# Patient Record
Sex: Female | Born: 1960 | Race: White | Hispanic: No | Marital: Married | Smoking: Former smoker
Health system: Southern US, Community
[De-identification: ages and names within clinical notes are randomized; demographics above are authoritative.]

## PROBLEM LIST (undated history)

## (undated) DIAGNOSIS — K5909 Other constipation: Secondary | ICD-10-CM

## (undated) DIAGNOSIS — F419 Anxiety disorder, unspecified: Secondary | ICD-10-CM

## (undated) DIAGNOSIS — F329 Major depressive disorder, single episode, unspecified: Secondary | ICD-10-CM

## (undated) DIAGNOSIS — M419 Scoliosis, unspecified: Secondary | ICD-10-CM

## (undated) DIAGNOSIS — K219 Gastro-esophageal reflux disease without esophagitis: Secondary | ICD-10-CM

## (undated) DIAGNOSIS — F909 Attention-deficit hyperactivity disorder, unspecified type: Secondary | ICD-10-CM

## (undated) DIAGNOSIS — Z973 Presence of spectacles and contact lenses: Secondary | ICD-10-CM

## (undated) DIAGNOSIS — M26609 Unspecified temporomandibular joint disorder, unspecified side: Secondary | ICD-10-CM

## (undated) DIAGNOSIS — F32A Depression, unspecified: Secondary | ICD-10-CM

## (undated) DIAGNOSIS — N201 Calculus of ureter: Secondary | ICD-10-CM

## (undated) DIAGNOSIS — E785 Hyperlipidemia, unspecified: Secondary | ICD-10-CM

## (undated) HISTORY — PX: BREAST BIOPSY: SHX20

## (undated) HISTORY — PX: TONSILLECTOMY: SUR1361

---
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
## 1998-12-17 ENCOUNTER — Encounter: Payer: Self-pay | Admitting: Obstetrics and Gynecology

## 1998-12-17 ENCOUNTER — Ambulatory Visit (HOSPITAL_COMMUNITY): Admission: RE | Admit: 1998-12-17 | Discharge: 1998-12-17 | Payer: Self-pay | Admitting: Obstetrics and Gynecology

## 1999-01-03 ENCOUNTER — Encounter: Payer: Self-pay | Admitting: Obstetrics and Gynecology

## 1999-01-03 ENCOUNTER — Ambulatory Visit (HOSPITAL_COMMUNITY): Admission: RE | Admit: 1999-01-03 | Discharge: 1999-01-03 | Payer: Self-pay | Admitting: Obstetrics and Gynecology

## 1999-01-18 ENCOUNTER — Ambulatory Visit (HOSPITAL_COMMUNITY): Admission: RE | Admit: 1999-01-18 | Discharge: 1999-01-18 | Payer: Self-pay | Admitting: Obstetrics and Gynecology

## 1999-01-18 ENCOUNTER — Encounter: Payer: Self-pay | Admitting: Obstetrics and Gynecology

## 1999-01-18 ENCOUNTER — Encounter (INDEPENDENT_AMBULATORY_CARE_PROVIDER_SITE_OTHER): Payer: Self-pay | Admitting: *Deleted

## 1999-03-05 ENCOUNTER — Other Ambulatory Visit: Admission: RE | Admit: 1999-03-05 | Discharge: 1999-03-05 | Payer: Self-pay | Admitting: Obstetrics and Gynecology

## 1999-06-27 ENCOUNTER — Emergency Department (HOSPITAL_COMMUNITY): Admission: EM | Admit: 1999-06-27 | Discharge: 1999-06-27 | Payer: Self-pay | Admitting: Emergency Medicine

## 1999-06-27 ENCOUNTER — Encounter: Payer: Self-pay | Admitting: Emergency Medicine

## 2000-04-30 ENCOUNTER — Encounter (INDEPENDENT_AMBULATORY_CARE_PROVIDER_SITE_OTHER): Payer: Self-pay | Admitting: Specialist

## 2000-04-30 ENCOUNTER — Ambulatory Visit (HOSPITAL_COMMUNITY): Admission: AD | Admit: 2000-04-30 | Discharge: 2000-04-30 | Payer: Self-pay | Admitting: Obstetrics and Gynecology

## 2000-04-30 HISTORY — PX: DILATATION & CURRETTAGE/HYSTEROSCOPY WITH RESECTOCOPE: SHX5572

## 2000-05-05 ENCOUNTER — Encounter: Payer: Self-pay | Admitting: Obstetrics and Gynecology

## 2000-05-05 ENCOUNTER — Ambulatory Visit (HOSPITAL_COMMUNITY): Admission: RE | Admit: 2000-05-05 | Discharge: 2000-05-05 | Payer: Self-pay | Admitting: Obstetrics and Gynecology

## 2000-05-07 ENCOUNTER — Other Ambulatory Visit: Admission: RE | Admit: 2000-05-07 | Discharge: 2000-05-07 | Payer: Self-pay | Admitting: Obstetrics and Gynecology

## 2000-05-12 ENCOUNTER — Inpatient Hospital Stay (HOSPITAL_COMMUNITY): Admission: RE | Admit: 2000-05-12 | Discharge: 2000-05-14 | Payer: Self-pay | Admitting: Obstetrics and Gynecology

## 2000-05-12 ENCOUNTER — Encounter (INDEPENDENT_AMBULATORY_CARE_PROVIDER_SITE_OTHER): Payer: Self-pay

## 2000-05-12 HISTORY — PX: VAGINAL HYSTERECTOMY: SUR661

## 2001-05-18 ENCOUNTER — Other Ambulatory Visit: Admission: RE | Admit: 2001-05-18 | Discharge: 2001-05-18 | Payer: Self-pay | Admitting: Obstetrics and Gynecology

## 2001-05-18 ENCOUNTER — Ambulatory Visit (HOSPITAL_COMMUNITY): Admission: RE | Admit: 2001-05-18 | Discharge: 2001-05-18 | Payer: Self-pay | Admitting: Obstetrics and Gynecology

## 2001-05-18 ENCOUNTER — Encounter: Payer: Self-pay | Admitting: Obstetrics and Gynecology

## 2002-05-20 ENCOUNTER — Encounter: Payer: Self-pay | Admitting: Obstetrics and Gynecology

## 2002-05-20 ENCOUNTER — Ambulatory Visit (HOSPITAL_COMMUNITY): Admission: RE | Admit: 2002-05-20 | Discharge: 2002-05-20 | Payer: Self-pay | Admitting: Obstetrics and Gynecology

## 2002-05-20 ENCOUNTER — Other Ambulatory Visit: Admission: RE | Admit: 2002-05-20 | Discharge: 2002-05-20 | Payer: Self-pay | Admitting: Obstetrics and Gynecology

## 2003-05-23 ENCOUNTER — Ambulatory Visit (HOSPITAL_COMMUNITY): Admission: RE | Admit: 2003-05-23 | Discharge: 2003-05-23 | Payer: Self-pay | Admitting: Obstetrics and Gynecology

## 2004-06-05 ENCOUNTER — Other Ambulatory Visit: Admission: RE | Admit: 2004-06-05 | Discharge: 2004-06-05 | Payer: Self-pay | Admitting: Obstetrics and Gynecology

## 2004-06-05 ENCOUNTER — Ambulatory Visit (HOSPITAL_COMMUNITY): Admission: RE | Admit: 2004-06-05 | Discharge: 2004-06-05 | Payer: Self-pay | Admitting: Obstetrics and Gynecology

## 2005-08-07 ENCOUNTER — Ambulatory Visit (HOSPITAL_COMMUNITY): Admission: RE | Admit: 2005-08-07 | Discharge: 2005-08-07 | Payer: Self-pay | Admitting: Obstetrics and Gynecology

## 2006-08-20 ENCOUNTER — Ambulatory Visit (HOSPITAL_COMMUNITY): Admission: RE | Admit: 2006-08-20 | Discharge: 2006-08-20 | Payer: Self-pay | Admitting: Obstetrics and Gynecology

## 2007-10-20 ENCOUNTER — Ambulatory Visit (HOSPITAL_COMMUNITY): Admission: RE | Admit: 2007-10-20 | Discharge: 2007-10-20 | Payer: Self-pay | Admitting: Obstetrics and Gynecology

## 2008-12-07 ENCOUNTER — Ambulatory Visit (HOSPITAL_COMMUNITY): Admission: RE | Admit: 2008-12-07 | Discharge: 2008-12-07 | Payer: Self-pay | Admitting: Obstetrics and Gynecology

## 2009-06-13 DIAGNOSIS — M4120 Other idiopathic scoliosis, site unspecified: Secondary | ICD-10-CM | POA: Insufficient documentation

## 2009-06-13 DIAGNOSIS — G43009 Migraine without aura, not intractable, without status migrainosus: Secondary | ICD-10-CM | POA: Insufficient documentation

## 2009-07-12 DIAGNOSIS — K5730 Diverticulosis of large intestine without perforation or abscess without bleeding: Secondary | ICD-10-CM | POA: Insufficient documentation

## 2009-07-16 ENCOUNTER — Encounter: Admission: RE | Admit: 2009-07-16 | Discharge: 2009-07-16 | Payer: Self-pay | Admitting: Family Medicine

## 2009-09-28 ENCOUNTER — Encounter: Admission: RE | Admit: 2009-09-28 | Discharge: 2009-09-28 | Payer: Self-pay | Admitting: Family Medicine

## 2009-12-10 ENCOUNTER — Ambulatory Visit (HOSPITAL_COMMUNITY): Admission: RE | Admit: 2009-12-10 | Discharge: 2009-12-10 | Payer: Self-pay | Admitting: Family Medicine

## 2010-06-26 ENCOUNTER — Encounter
Admission: RE | Admit: 2010-06-26 | Discharge: 2010-06-26 | Payer: Self-pay | Source: Home / Self Care | Admitting: Specialist

## 2010-07-01 HISTORY — PX: LAPAROSCOPIC CHOLECYSTECTOMY: SUR755

## 2010-07-10 ENCOUNTER — Ambulatory Visit (HOSPITAL_COMMUNITY)
Admission: RE | Admit: 2010-07-10 | Discharge: 2010-07-11 | Payer: Self-pay | Source: Home / Self Care | Attending: General Surgery | Admitting: General Surgery

## 2010-07-20 ENCOUNTER — Encounter
Admission: RE | Admit: 2010-07-20 | Discharge: 2010-07-20 | Payer: Self-pay | Source: Home / Self Care | Attending: Specialist | Admitting: Specialist

## 2010-09-30 LAB — COMPREHENSIVE METABOLIC PANEL
ALT: 31 U/L (ref 0–35)
AST: 30 U/L (ref 0–37)
Albumin: 3.4 g/dL — ABNORMAL LOW (ref 3.5–5.2)
Alkaline Phosphatase: 72 U/L (ref 39–117)
BUN: 10 mg/dL (ref 6–23)
CO2: 32 mEq/L (ref 19–32)
Calcium: 9 mg/dL (ref 8.4–10.5)
Chloride: 100 mEq/L (ref 96–112)
Creatinine, Ser: 0.81 mg/dL (ref 0.4–1.2)
GFR calc Af Amer: >60 mL/min (ref >60)
GFR calc non Af Amer: >60 mL/min (ref >60)
Glucose, Bld: 95 mg/dL (ref 70–99)
Potassium: 4.4 mEq/L (ref 3.5–5.1)
Sodium: 139 mEq/L (ref 135–145)
Total Bilirubin: 0.3 mg/dL (ref 0.3–1.2)
Total Protein: 6.6 g/dL (ref 6.0–8.3)

## 2010-09-30 LAB — DIFFERENTIAL
Basophils Absolute: 0.1 10*3/uL (ref 0.0–0.1)
Basophils Relative: 1 % (ref 0–1)
Eosinophils Absolute: 0.2 10*3/uL (ref 0.0–0.7)
Eosinophils Relative: 3 % (ref 0–5)
Lymphocytes Relative: 46 % (ref 12–46)
Lymphs Abs: 3.2 10*3/uL (ref 0.7–4.0)
Monocytes Absolute: 0.4 10*3/uL (ref 0.1–1.0)
Monocytes Relative: 6 % (ref 3–12)
Neutro Abs: 3.1 10*3/uL (ref 1.7–7.7)
Neutrophils Relative %: 44 % (ref 43–77)

## 2010-09-30 LAB — SURGICAL PCR SCREEN
MRSA, PCR: NEGATIVE
Staphylococcus aureus: NEGATIVE

## 2010-09-30 LAB — CBC
HCT: 40.4 % (ref 36.0–46.0)
Hemoglobin: 13.6 g/dL (ref 12.0–15.0)
MCH: 32.9 pg (ref 26.0–34.0)
MCHC: 33.7 g/dL (ref 30.0–36.0)
MCV: 97.6 fL (ref 78.0–100.0)
Platelets: 308 10*3/uL (ref 150–400)
RBC: 4.14 MIL/uL (ref 3.87–5.11)
RDW: 12.8 % (ref 11.5–15.5)
WBC: 7.1 10*3/uL (ref 4.0–10.5)

## 2010-09-30 LAB — APTT: aPTT: 28 seconds (ref 24–37)

## 2010-09-30 LAB — PROTIME-INR
INR: 0.97 (ref 0.00–1.49)
Prothrombin Time: 13.1 seconds (ref 11.6–15.2)

## 2010-10-03 ENCOUNTER — Other Ambulatory Visit: Payer: Self-pay | Admitting: Family Medicine

## 2010-10-03 DIAGNOSIS — Z1231 Encounter for screening mammogram for malignant neoplasm of breast: Secondary | ICD-10-CM

## 2010-12-06 NOTE — Discharge Summary (Signed)
[facility]  Patient:    Marissa Maxwell, Marissa Maxwell                       MRN: [id number] Adm. Date:  [id number] Disc. Date: 05/14/00 Attending:  Malon Kindle                           Discharge Summary  HOSPITAL COURSE: A 50 year old with severe menorrhagia, anemia, and submucous fibroid admitted for vaginal hysterectomy.  The patient underwent a vaginal hysterectomy by Dr. Ambrose Mantle with Dr. Jackelyn Knife assisting under general anesthesia on May 12, 2000.  Blood loss about 200 cc.  Postoperatively, the patient did well, although she does state she awakened in the recovery room with very severe low back pain.  She has had no evidence of muscle weakness or nerve injury.  The pain is over the sacral area and is thought to be related to either the position or the hysterectomy itself.  The patient has voided well since her catheter has been removed.  She is passing flatus, tolerating a regular diet, and is ready for discharge.  LABORATORY DATA:  The pathology report showed the uterus with no pathologic abnormalities of the cervix, benign proliferative endometrium, adenomyosis, benign submucosal leiomyoma.  Hemoglobin on admission was 10.8, hematocrit 31.2, white count 8100.  Normal indices.  Platelet count 317,000, 61 neutrophils, 30 lymphs, 3 monos, 2 eosinophils, and 1 basophil.  Comprehensive metabolic profile was completely normal as was the urinalysis.  Blood group and type was A positive with a negative antibody.  FINAL DIAGNOSES: 1. Severe menorrhagia. 2. Anemia. 3. Submucous fibroid. 4. Adenomyosis.  OPERATION:  Vaginal hysterectomy.  Operator: Malachi Pro. Ambrose Mantle, M.D.  FINAL CONDITION:  Improved.  DISCHARGE INSTRUCTIONS:  Regular discharge instructions.  No vaginal entrance, no heavy lifting or strenuous activity.  Call with temperature elevation greater than 100.4 degrees.  Call with any heavy vaginal bleeding.  Call with any unusual  problems.  Mepergan Fortis 20 tablets 1 every 4 to 6 hours is given at discharge.  The patient is advised to use heat on her lower back and return to the office in two weeks for followup examination. DD:  05/14/00 TD:  05/14/00 Job: [id number]

## 2010-12-06 NOTE — Op Note (Signed)
[place]  Patient:    Marissa Maxwell, Marissa Maxwell                         MRN: [id number] Proc. Date: 04/30/00 Attending:  Malachi Pro. Ambrose Mantle, M.D.                           Operative Report  PREOPERATIVE DIAGNOSIS:  Menorrhagia.  POSTOPERATIVE DIAGNOSIS:  Menorrhagia.  Submucous fibroid.  OPERATION PERFORMED:  Dilatation and curettage, hysteroscopy.  Partial resection of submucous fibroid.  SURGEON:  Malachi Pro. Ambrose Mantle, M.D.  ANESTHESIA:  General.  DESCRIPTION OF PROCEDURE:  The patient was brought to the operating room and placed under satisfactory general endotracheal and placed in lithotomy.  She had begun her period on April 20, 2000.  It lasted for five or six days and then almost quit and then beginning on April 25, 2000, she began bleeding extremely heavily, passing large clots and today she became lightheaded.  She came to the office for examination.  She was noted to 30 cc of blood clot in her vagina.  After it was evacuated, she was examined again 5 or 10 minutes later and another 30 cc clot was present.  There were no lacerations in the vagina.  The bleeding was coming from the uterus.  The patient was scheduled for D&C after being off her birth control pills for just observation.  After she was seen, she said the bleeding stopped for a couple of hours but then started heavy again.  In lithotomy position, the exam revealed the uterus to be anterior upper limits normal size, the adnexa were free of masses.  The vulva, vagina and perineum and urethra were prepped with Betadine solution, the bladder was emptied with [place] catheter although there was no urine present.  The cervix was drawn into the operative field and dilated to a 35 Pratt dilator.  Because the patient was bleeding fairly heavily, I elected to do a fractional D&C to start with, curetting the endocervix, preserving that tissue, then curetting the endometrial cavity and preserving  that tissue. There felt to be an irregularity on the posterior surface of the uterus.  I inserted the hysteroscope after preserving the endometrial tissue and went all the way to the fundus and really could see no abnormalities but as I retracted the fundus down close to the lower uterine segment, there was an obvious submucous tumor that was either a polyp or a fibroid.  I tried to biopsy it with the small biopsy forceps and got no tissue.  I then changed the resectoscope and had had persistent difficulty difficult visualizing the area above the fibroid since it was so large and basically filled the cavity.  I would place the loop above the fibroid and I did make a couple of resections, but I never was able to feel comfortable about the upper extent of the loop. I could not see around it, so I tried to use polyp forceps and uterine dressing forceps to grasp the fibroid which I did and removed several pieces of the fibroid but was never able to remove the great majority of the fibroid but because of the difficulty as stated earlier, I did not think it was wise to persist in trying to use electrical current and I terminated the procedure. Blood loss was probably no more than 25 cc.  The patient was returned to recovery.  She will  either require Lupron to possibly shrink the fibroid and then have endometrial resection by hysteroscopy or a hysterectomy. DD:  05/01/99 TD:  05/02/00 Job: [id number]

## 2010-12-06 NOTE — H&P (Signed)
[facility]  Patient:    Marissa Maxwell, Marissa Maxwell                       MRN: [id number] Adm. Date:  [id number] Disc. Date: [id number] Attending:  Malon Kindle                         History and Physical  HISTORY OF PRESENT ILLNESS:  The patient is a 50 year old white married female, para 3-0-1-3 who is admitted to the hospital for hysterectomy because of submucous myoma and history of extremely severe menorrhagia.  Her last menstrual period was on April 20, 2000.  It persisted for three weeks.  She was seen in the office on April 30, 2000, and she was bleeding extremely heavily.  She was scheduled for D&C to stop the bleeding and, at hysteroscopy, was noted to have a large submucous myoma coming from the posterior aspect of the right side of the uterus.  It was difficult to remove this because the loop electrode would get lost at it passed over the large submucous myoma.  I therefore advised her after the D&C to consider her options of Lupron followed by repeat hysteroscopy and attempted removal of the submucous myoma or hysterectomy.  She chose to proceed with hysterectomy.  ALLERGIES:  PENICILLIN, which causes a RASH.  PAST SURGICAL HISTORY:  Cesarean section.  T&A.  Scoliosis surgery with insertion of Harrington rods.  D&C in 2001.  PAST MEDICAL HISTORY:  Usual childhood diseases.  FAMILY HISTORY:  Her mother is 20, living and well.  Her father is 70 and had myocardial infarction at the age of 71.  One brother and one sister living and well.  ALCOHOL AND TOBACCO:  None.  MEDICATIONS:  Vitamins.  REVIEW OF SYSTEMS:  Frequent headaches.  PHYSICAL EXAMINATION:  GENERAL:  Well-developed, well-nourished white female.  VITAL SIGNS:  Blood pressure 100/60, pulse 80.  HEENT:  No cranial abnormalities.  Extraocular movements intact.  Nose and pharynx clear.  NECK:  Supple without thyromegaly.  HEART:  Normal size and sounds.  No  murmurs.  LUNGS:  Clear to P&A.  BREASTS:  Soft without masses on April 30, 2000.  ABDOMEN:  Soft, flat and nontender.  PELVIC:  Dark blood in the vagina.  The cervix is clean.  The uterus is anterior, upper limit of normal size.  Adnexa are free of masses.  ADMITTING IMPRESSION:  Submucous myoma with a history of extremely severe menorrhagia.  PLAN:  The patient is admitted for vaginal hysterectomy followed by abdominal hysterectomy if the vaginal hysterectomy is unsuccessful.  She has been informed of the potential risks of surgery including but not limited to pulmonary embolus, wound disruption, hemorrhage with need for reoperation and/or transfusion, fistula formation, nerve injury, and intestinal obstruction.  She understands and agrees to proceed.  Please note that the biopsies on the D&C and hysteroscopy were smooth muscle consistent with leiomyoma and benign endometrium and endocervical curettings.  The patient also has a history of cervical dysplasia treated with cryotherapy. DD:  05/12/00 TD:  05/12/00 Job: [id number]

## 2010-12-06 NOTE — Op Note (Signed)
[facility]  Patient:    Marissa Maxwell, Marissa Maxwell                       MRN: [id number] Proc. Date: 05/12/00 Adm. Date:  [id number] Disc. Date: [id number] Attending:  Malon Kindle                           Operative Report  PREOPERATIVE DIAGNOSES: 1. Submucous leiomyoma. 2. Menorrhagia. 3. Anemia.  POSTOPERATIVE DIAGNOSES: 1. Submucous leiomyoma. 2. Menorrhagia. 3. Anemia.  PROCEDURE:  Vaginal hysterectomy.  SURGEON:  Malachi Pro. Ambrose Mantle, M.D.  ASSISTANT:  Zenaida Niece, M.D.  ANESTHESIA:  General anesthesia.  DESCRIPTION OF PROCEDURE:  The patient was brought to the operating room and placed under satisfactory general anesthesia and placed in lithotomy position. Exam revealed the uterus to be anterior, upper limit of normal size, the adnexa free of masses.  The vulva, vagina, perineum, and urethra were prepped with Betadine solution.  A Foley catheter was inserted to straight drain.  The area was draped as a sterile field.  The cervix was drawn into the operative field, and the cervicovaginal junction was injected with a dilute solution of Neo-Synephrine.  A circumferential incision was made around the cervix at the cervicovaginal junction.  The vaginal mucosa was pushed anteriorly and posteriorly.  The posterior cul-de-sac was identified and entered by sharp dissection.  Both uterosacral ligaments were clamped, cut, and suture ligated and held.  The cardinal ligaments were clamped, cut, and suture ligated, and additional bites above the cardinal ligaments were taken and suture ligated. The anterior peritoneum was identified and entered.  The bladder was retracted away.  The additional bites were taken above the uterine vessels.  The uterus was then inverted through the incision in the cul-de-sac, and the upper pedicles were clamped across and doubly suture ligated.  Hemostasis was evaluated.  The posterior cuff was sutured to ensure  hemostasis.  A pursestring suture of #1 Vicryl was placed around the peritoneum, starting anteriorly with the peritoneum, incorporating the upper and lower pedicles, and posterior cul-de-sac on the left, along with the right uterosacral, right upper pedicle, and anterior peritoneum.  Before tying this down, I reinspected to try to be sure that hemostasis was completely adequate.  I secured complete hemostasis, tied the peritoneal pursestring suture down.  I did not actually feel either ovary or see either ovary.  I then closed the vaginal mucosa with interrupted figure-of-eight sutures of 0 Vicryl, placed a two-inch iodoform pack in the vagina, and terminated the procedure.  Blood loss was thought to be about 200 cc.  Sponge and needle counts were correct, and the patient was returned to recovery in satisfactory condition.  The uterus was cut open at the operating table, and the submucous fibroid on the right posterior wall of the uterus was confirmed.  The fibroid was probably about 2.5-3 cm in diameter. DD:  05/12/00 TD:  05/13/00 Job: [id number]

## 2010-12-12 ENCOUNTER — Ambulatory Visit: Payer: Self-pay

## 2011-02-18 ENCOUNTER — Other Ambulatory Visit (HOSPITAL_COMMUNITY): Payer: Self-pay | Admitting: Family Medicine

## 2011-02-18 DIAGNOSIS — Z1231 Encounter for screening mammogram for malignant neoplasm of breast: Secondary | ICD-10-CM

## 2011-02-26 ENCOUNTER — Ambulatory Visit (HOSPITAL_COMMUNITY)
Admission: RE | Admit: 2011-02-26 | Discharge: 2011-02-26 | Disposition: A | Discharge status: Home / Self Care | Payer: 59 | Source: Ambulatory Visit | Attending: Family Medicine | Admitting: Family Medicine

## 2011-02-26 DIAGNOSIS — Z1231 Encounter for screening mammogram for malignant neoplasm of breast: Secondary | ICD-10-CM | POA: Insufficient documentation

## 2011-06-26 ENCOUNTER — Other Ambulatory Visit: Payer: Self-pay | Admitting: Orthopedic Surgery

## 2011-07-01 ENCOUNTER — Encounter (HOSPITAL_BASED_OUTPATIENT_CLINIC_OR_DEPARTMENT_OTHER): Payer: Self-pay

## 2011-07-01 NOTE — Progress Notes (Signed)
Patient instructed to bring all meds.

## 2011-07-03 ENCOUNTER — Encounter (HOSPITAL_BASED_OUTPATIENT_CLINIC_OR_DEPARTMENT_OTHER): Payer: Self-pay | Admitting: Orthopedic Surgery

## 2011-07-03 ENCOUNTER — Encounter (HOSPITAL_BASED_OUTPATIENT_CLINIC_OR_DEPARTMENT_OTHER)
Admission: RE | Disposition: A | Discharge status: Home / Self Care | Payer: Self-pay | Source: Ambulatory Visit | Attending: Orthopedic Surgery

## 2011-07-03 ENCOUNTER — Encounter (HOSPITAL_BASED_OUTPATIENT_CLINIC_OR_DEPARTMENT_OTHER): Payer: Self-pay | Admitting: Certified Registered Nurse Anesthetist

## 2011-07-03 ENCOUNTER — Ambulatory Visit (HOSPITAL_BASED_OUTPATIENT_CLINIC_OR_DEPARTMENT_OTHER): Payer: 59 | Admitting: Certified Registered Nurse Anesthetist

## 2011-07-03 ENCOUNTER — Encounter (HOSPITAL_BASED_OUTPATIENT_CLINIC_OR_DEPARTMENT_OTHER): Payer: Self-pay | Admitting: *Deleted

## 2011-07-03 ENCOUNTER — Ambulatory Visit (HOSPITAL_BASED_OUTPATIENT_CLINIC_OR_DEPARTMENT_OTHER)
Admission: RE | Admit: 2011-07-03 | Discharge: 2011-07-03 | Disposition: A | Discharge status: Home / Self Care | Payer: 59 | Source: Ambulatory Visit | Attending: Orthopedic Surgery | Admitting: Orthopedic Surgery

## 2011-07-03 DIAGNOSIS — G5600 Carpal tunnel syndrome, unspecified upper limb: Secondary | ICD-10-CM | POA: Insufficient documentation

## 2011-07-03 DIAGNOSIS — K219 Gastro-esophageal reflux disease without esophagitis: Secondary | ICD-10-CM | POA: Insufficient documentation

## 2011-07-03 DIAGNOSIS — Z01812 Encounter for preprocedural laboratory examination: Secondary | ICD-10-CM | POA: Insufficient documentation

## 2011-07-03 HISTORY — DX: Depression, unspecified: F32.A

## 2011-07-03 HISTORY — DX: Major depressive disorder, single episode, unspecified: F32.9

## 2011-07-03 HISTORY — DX: Anxiety disorder, unspecified: F41.9

## 2011-07-03 HISTORY — PX: CARPAL TUNNEL RELEASE: SHX101

## 2011-07-03 HISTORY — DX: Gastro-esophageal reflux disease without esophagitis: K21.9

## 2011-07-03 SURGERY — CARPAL TUNNEL RELEASE
Anesthesia: General | Site: Hand | Laterality: Right | Wound class: Clean

## 2011-07-03 MED ORDER — FENTANYL CITRATE 0.05 MG/ML IJ SOLN
INTRAMUSCULAR | Status: DC | PRN
  Administered 2011-07-03: 50 ug via INTRAVENOUS

## 2011-07-03 MED ORDER — LACTATED RINGERS IV SOLN
INTRAVENOUS | Status: DC
Start: 2011-07-03 — End: 2011-07-03
  Administered 2011-07-03 (×2): via INTRAVENOUS

## 2011-07-03 MED ORDER — OXYCODONE-ACETAMINOPHEN 5-325 MG PO TABS: 1.0000 | ORAL_TABLET | ORAL | Status: AC | PRN

## 2011-07-03 MED ORDER — FENTANYL CITRATE 0.05 MG/ML IJ SOLN: 50.0000 ug | INTRAMUSCULAR | Status: DC | PRN

## 2011-07-03 MED ORDER — OXYCODONE-ACETAMINOPHEN 5-325 MG PO TABS
1.0000 | ORAL_TABLET | ORAL | Status: DC | PRN
  Administered 2011-07-03: 1 via ORAL

## 2011-07-03 MED ORDER — PROPOFOL 10 MG/ML IV EMUL
INTRAVENOUS | Status: DC | PRN
  Administered 2011-07-03: 200 mg via INTRAVENOUS

## 2011-07-03 MED ORDER — DEXAMETHASONE SODIUM PHOSPHATE 10 MG/ML IJ SOLN
INTRAMUSCULAR | Status: DC | PRN
  Administered 2011-07-03: 10 mg via INTRAVENOUS

## 2011-07-03 MED ORDER — MORPHINE SULFATE 2 MG/ML IJ SOLN: 0.0500 mg/kg | INTRAMUSCULAR | Status: DC | PRN

## 2011-07-03 MED ORDER — METOCLOPRAMIDE HCL 5 MG/ML IJ SOLN: 10.0000 mg | Freq: Once | INTRAMUSCULAR | Status: DC | PRN

## 2011-07-03 MED ORDER — CHLORHEXIDINE GLUCONATE 4 % EX LIQD: 60.0000 mL | Freq: Once | CUTANEOUS | Status: DC

## 2011-07-03 MED ORDER — MIDAZOLAM HCL 2 MG/2ML IJ SOLN: 0.5000 mg | INTRAMUSCULAR | Status: DC | PRN

## 2011-07-03 MED ORDER — CEFAZOLIN SODIUM 1-5 GM-% IV SOLN: 1.0000 g | Freq: Once | INTRAVENOUS | Status: DC

## 2011-07-03 MED ORDER — LIDOCAINE HCL (CARDIAC) 20 MG/ML IV SOLN
INTRAVENOUS | Status: DC | PRN
  Administered 2011-07-03: 40 mg via INTRAVENOUS

## 2011-07-03 MED ORDER — MIDAZOLAM HCL 5 MG/5ML IJ SOLN
INTRAMUSCULAR | Status: DC | PRN
  Administered 2011-07-03: 2 mg via INTRAVENOUS

## 2011-07-03 MED ORDER — LIDOCAINE HCL 2 % IJ SOLN
INTRAMUSCULAR | Status: DC | PRN
  Administered 2011-07-03: 4 mL

## 2011-07-03 MED ORDER — ONDANSETRON HCL 4 MG/2ML IJ SOLN
INTRAMUSCULAR | Status: DC | PRN
  Administered 2011-07-03: 4 mg via INTRAVENOUS

## 2011-07-03 MED ORDER — FENTANYL CITRATE 0.05 MG/ML IJ SOLN
25.0000 ug | INTRAMUSCULAR | Status: DC | PRN
  Administered 2011-07-03 (×2): 25 ug via INTRAVENOUS

## 2011-07-03 SURGICAL SUPPLY — 37 items
BANDAGE ADHESIVE 1X3 (GAUZE/BANDAGES/DRESSINGS) IMPLANT
BANDAGE ELASTIC 3 VELCRO ST LF (GAUZE/BANDAGES/DRESSINGS) ×2 IMPLANT
BLADE SURG 15 STRL LF DISP TIS (BLADE) ×1 IMPLANT
BLADE SURG 15 STRL SS (BLADE) ×1
BNDG ESMARK 4X9 LF (GAUZE/BANDAGES/DRESSINGS) ×2 IMPLANT
BRUSH SCRUB EZ PLAIN DRY (MISCELLANEOUS) ×2 IMPLANT
CLOTH BEACON ORANGE TIMEOUT ST (SAFETY) ×2 IMPLANT
CORDS BIPOLAR (ELECTRODE) ×2 IMPLANT
COVER MAYO STAND STRL (DRAPES) ×2 IMPLANT
COVER TABLE BACK 60X90 (DRAPES) ×2 IMPLANT
CUFF TOURNIQUET SINGLE 18IN (TOURNIQUET CUFF) ×2 IMPLANT
DECANTER SPIKE VIAL GLASS SM (MISCELLANEOUS) IMPLANT
DRAPE EXTREMITY T 121X128X90 (DRAPE) ×2 IMPLANT
DRAPE SURG 17X23 STRL (DRAPES) ×2 IMPLANT
GLOVE BIOGEL M 6.5 STRL (GLOVE) ×2 IMPLANT
GLOVE BIOGEL M STRL SZ7.5 (GLOVE) ×2 IMPLANT
GLOVE INDICATOR 7.0 STRL GRN (GLOVE) ×4 IMPLANT
GLOVE ORTHO TXT STRL SZ7.5 (GLOVE) ×2 IMPLANT
GOWN PREVENTION PLUS XLARGE (GOWN DISPOSABLE) ×2 IMPLANT
GOWN PREVENTION PLUS XXLARGE (GOWN DISPOSABLE) ×4 IMPLANT
NEEDLE 27GAX1X1/2 (NEEDLE) ×2 IMPLANT
PACK BASIN DAY SURGERY FS (CUSTOM PROCEDURE TRAY) ×2 IMPLANT
PAD CAST 3X4 CTTN HI CHSV (CAST SUPPLIES) ×1 IMPLANT
PADDING CAST ABS 4INX4YD NS (CAST SUPPLIES) ×1
PADDING CAST ABS COTTON 4X4 ST (CAST SUPPLIES) ×1 IMPLANT
PADDING CAST COTTON 3X4 STRL (CAST SUPPLIES) ×1
SPLINT PLASTER CAST XFAST 3X15 (CAST SUPPLIES) ×5 IMPLANT
SPLINT PLASTER XTRA FASTSET 3X (CAST SUPPLIES) ×5
SPONGE GAUZE 4X4 12PLY (GAUZE/BANDAGES/DRESSINGS) ×2 IMPLANT
STOCKINETTE 4X48 STRL (DRAPES) ×2 IMPLANT
STRIP CLOSURE SKIN 1/2X4 (GAUZE/BANDAGES/DRESSINGS) ×2 IMPLANT
SUT PROLENE 3 0 PS 2 (SUTURE) ×2 IMPLANT
SYR 3ML 23GX1 SAFETY (SYRINGE) IMPLANT
SYR CONTROL 10ML LL (SYRINGE) ×2 IMPLANT
TRAY DSU PREP LF (CUSTOM PROCEDURE TRAY) ×2 IMPLANT
UNDERPAD 30X30 INCONTINENT (UNDERPADS AND DIAPERS) ×2 IMPLANT
WATER STERILE IRR 1000ML POUR (IV SOLUTION) IMPLANT

## 2011-07-03 NOTE — H&P (Signed)
Marissa Maxwell is an 50 y.o. female.   Chief Complaint: Complaining of chronic and progressive right hand numbness and tingling. HPI: Marissa Maxwell is a 50 year old right-hand-dominant female who presented to our office at the beginning of December 2012 complaining of chronic and progressive right hand numbness and tingling. She had previous nerve conduction studies in 2011 which revealed right carpal tunnel syndrome. The patient is being awakened at night 5-6 times per week with persistent numbness and tingling. She wishes to proceed with surgical intervention.  Past Medical History  Diagnosis Date  . Scoliosis      two herrington rods  . GERD (gastroesophageal reflux disease)   . Anxiety   . Depression     Past Surgical History  Procedure Date  . Abdominal hysterectomy   . Lap choli   . Tonsillectomy   . Back surgery     History reviewed. No pertinent family history. Social History:  does not have a smoking history on file. She does not have any smokeless tobacco history on file. She reports that she does not drink alcohol or use illicit drugs.  Allergies:  Allergies  Allergen Reactions  . Penicillins Rash    Medications Prior to Admission  Medication Dose Route Frequency Provider Last Rate Last Dose  . ceFAZolin (ANCEF) IVPB 1 g/50 mL premix  1 g Intravenous Once       . chlorhexidine (HIBICLENS) 4 % liquid 4 application  60 mL Topical Once       . lactated ringers infusion   Intravenous Continuous Constance Goltz, MD       Medications Prior to Admission  Medication Sig Dispense Refill  . alum & mag hydroxide-simeth (MAALOX PLUS) 400-400-40 MG/5ML suspension Take by mouth every 6 (six) hours as needed.        Marland Kitchen amitriptyline (ELAVIL) 100 MG tablet Take 100 mg by mouth at bedtime.        . calcium-vitamin D 250-100 MG-UNIT per tablet Take 1 tablet by mouth 2 (two) times daily.        Marland Kitchen docusate sodium (COLACE) 100 MG capsule Take 100 mg by mouth 2 (two) times daily.         . fish oil-omega-3 fatty acids 1000 MG capsule Take 2 g by mouth daily.        Marland Kitchen HYDROcodone-acetaminophen (VICODIN) 5-500 MG per tablet Take 1 tablet by mouth every 6 (six) hours as needed.        . Multiple Vitamin (MULTIVITAMIN) capsule Take 1 capsule by mouth daily.        . Nutritional Supplements (ESTROVEN PO) Take by mouth.          No results found for this or any previous visit (from the past 48 hour(s)).  No results found.   Pertinent items are noted in HPI.  Blood pressure 133/84, pulse 91, temperature 97.7 F (36.5 C), temperature source Oral, resp. rate 20, height 5\' 3"  (1.6 m), weight 68.04 kg (150 lb), SpO2 99.00%.  General appearance: alert Head: Normocephalic, without obvious abnormality Neck: supple, symmetrical, trachea midline Resp: clear to auscultation bilaterally Cardio: regular rate and rhythm, S1, S2 normal, no murmur, click, rub or gallop GI: normal findings: bowel sounds normal Extremities examination of the right hand revealed normal sweat pattern. She has full range of motion of all digits without triggering. She has a positive Tinel's and positive Phalen's. Nerve conduction tests revealed right carpal tunnel syndrome. Pulses: 2+ and symmetric Skin: normal Neurologic: Grossly normal  Assessment/Plan Impression: Right carpal tunnel syndrome  Plan patient to be taken to the operating room to undergo right carpal tunnel release. The procedure risks benefits and postoperative course were discussed with the patient at length and she was in agreement with this plan.  DASNOIT,Dorene Bruni J 07/03/2011, 7:28 AM    H&P documentation: 07/03/2011  -History and Physical Reviewed  -Patient has been re-examined  -No change in the plan of care  Wyn Forster, MD

## 2011-07-03 NOTE — Op Note (Signed)
NAME:  Giuliano, Lamyiah                     ACCOUNT NO.:  MEDICAL RECORD NO.:  [id number]  LOCATION:                                 FACILITY:  PHYSICIAN:  Katy Fitch. Adwoa Axe, M.D. DATE OF BIRTH:  1961-03-23  DATE OF PROCEDURE:  07/03/2011 DATE OF DISCHARGE:                              OPERATIVE REPORT   PREOPERATIVE DIAGNOSIS:  Severe right carpal tunnel syndrome.  POSTOPERATIVE DIAGNOSIS:  Severe right carpal tunnel syndrome.  OPERATION:  Release of right transverse carpal ligament.  OPERATING SURGEON:  Katy Fitch. Sandar Krinke, MD  ASSISTANT:  Marveen Reeks Dasnoit, PA-C  ANESTHESIA:  General by LMA.  SUPERVISING ANESTHESIOLOGIST:  Janetta Hora. Gelene Mink, MD  INDICATIONS:  Marissa Maxwell is a 50 year old woman referred through the courtesy of Dr. Duanne Guess for evaluation and management of bilateral hand numbness.  Clinical examination suggested carpal tunnel syndrome. Electrodiagnostic studies confirmed the presence of significant entrapment neuropathy.  We recommended proceeding with release of her right transverse carpal ligament at this time as she has failed nonoperative measures.  Preoperatively, she was reminded of the potential risks and benefits of surgery.  Questions were invited and answered in detail.  PROCEDURE:  Basha Krygier was brought to room 1 of the [facility] and placed in supine position upon the operating table.  Following the induction of general anesthesia by LMA technique under Dr. Thornton Dales direct  supervision, the right arm was prepped with Betadine soap and solution and sterilely draped.  A pneumatic tourniquet was applied to the proximal right brachium.  Following exsanguination of the right arm with Esmarch bandage, the arterial tourniquet was inflated to 220 mmHg.  Following a routine surgical time-out, the arm was exsanguinated with Esmarch bandage, the arterial tourniquet inflated to 220 mmHg.  The procedure commenced with a short incision in  line of the ring finger and the palm.  Subcutaneous tissues were carefully divided revealing the palmar fascia.  This was split longitudinally to reveal the common sensory branch of the median nerve.  There was large sensory branch that was directly in the path of the incision which was carefully protected throughout the dissection.  The distal margin of the transverse carpal ligament was isolated.  The canal sounded with a Insurance risk surveyor.  Bleeding points were electrocauterized with bipolar current followed by release of the ligament with a pair of Rhytidectomy scissors.  The carpal tunnel was widely opened.  No masses or predicaments were noted.  Bleeding points along the margin of the released ligament were electrocauterized with bipolar current followed by repair of the skin with intradermal 3-0 Prolene suture.  A compressive dressing was applied with a volar plaster splint maintaining the wrist in 10 degrees of dorsiflexion.  For aftercare, Ms. Roswell is provided prescription for Percocet 5 mg 1 p.o. q.4-6 h. p.r.n. pain, 20 tablets without refill.  We will see her back for followup in 8 days for a dressing change, suture removal, and initiation of postoperative rehabilitation program.     Katy Fitch. Lacy Sofia, M.D.     RVS/MEDQ  D:  07/03/2011  T:  07/03/2011  Job:  [id number]

## 2011-07-03 NOTE — H&P (Addendum)
   PATIENT:    Marissa Maxwell                    SEEN BY: Katy Fitch. Aleana Fifita, Montez Hageman MD   OFFICE VISIT:    12.5.12  DOB:    [birth year]  Kierstynn returns for follow-up examination of her hands. She has persistent numbness when she knits or does other stress activities.  She had electrodiagnostic studies in 2011 documenting bilateral carpal tunnel syndrome.  She has a positive wrist flexion test and improvement when she wears her night splints. She has had steroid injections with only transient relief.  I have advised her to strongly consider proceeding with release of her right transverse carpal ligament.  We will schedule this at a mutually convenient time in the future.  Questions were invited and answered in detail.   Katy Fitch Meril Dray, M.D., Jr./cmf   T:  12.7.12   H&P documentation: 07/04/2011  -History and Physical Reviewed  -Patient has been re-examined  -No change in the plan of care  Wyn Forster, MD

## 2011-07-03 NOTE — Transfer of Care (Signed)
Immediate Anesthesia Transfer of Care Note  Patient: Marissa Maxwell  Procedure(s) Performed:  CARPAL TUNNEL RELEASE  Patient Location: PACU  Anesthesia Type: General  Level of Consciousness: awake, alert , oriented and patient cooperative  Airway & Oxygen Therapy: Patient Spontanous Breathing and Patient connected to face mask oxygen  Post-op Assessment: Report given to PACU RN, Post -op Vital signs reviewed and stable and Patient moving all extremities  Post vital signs: Reviewed and stable  Complications: No apparent anesthesia complications

## 2011-07-03 NOTE — Brief Op Note (Addendum)
07/03/2011  8:55 AM  PATIENT:  Marissa Maxwell  50 y.o. female  PRE-OPERATIVE DIAGNOSIS:  Right carpal tunnel syndrome  POST-OPERATIVE DIAGNOSIS: Rightt Carpal Tunnel Release   PROCEDURE:  Procedure(s): RIGHT CARPAL TUNNEL RELEASE   SURGEON:  Surgeon(s): Wyn Forster., MD  PHYSICIAN ASSISTANT:   ASSISTANTS: Mallory Shirk.A-C  ANESTHESIA:   general  EBL:     BLOOD ADMINISTERED:none  DRAINS: none   LOCAL MEDICATIONS USED:  XYLOCAINE 3 CC  SPECIMEN:  No Specimen  DISPOSITION OF SPECIMEN:  N/A  COUNTS:  YES  TOURNIQUET:   Total Tourniquet Time Documented: Upper Arm (Right) - 7 minutes  DICTATION: .Other Dictation: Dictation Number [id number]  PLAN OF CARE: Discharge to home after PACU  PATIENT DISPOSITION:  PACU - hemodynamically stable.

## 2011-07-03 NOTE — Anesthesia Preprocedure Evaluation (Addendum)
Anesthesia Evaluation  Patient identified by MRN, date of birth, ID band Patient awake    Reviewed: Allergy & Precautions, H&P , NPO status , Patient's Chart, lab work & pertinent test results, reviewed documented beta blocker date and time   Airway Mallampati: II TM Distance: >3 FB Neck ROM: full    Dental   Pulmonary neg pulmonary ROS,          Cardiovascular neg cardio ROS     Neuro/Psych PSYCHIATRIC DISORDERS Negative Neurological ROS     GI/Hepatic negative GI ROS, Neg liver ROS, GERD-  Medicated and Controlled,  Endo/Other  Negative Endocrine ROS  Renal/GU negative Renal ROS  Genitourinary negative   Musculoskeletal   Abdominal   Peds  Hematology negative hematology ROS (+)   Anesthesia Other Findings See surgeon's H&P   Reproductive/Obstetrics negative OB ROS                          Anesthesia Physical Anesthesia Plan  ASA: II  Anesthesia Plan: General   Post-op Pain Management:    Induction: Intravenous  Airway Management Planned: LMA  Additional Equipment:   Intra-op Plan:   Post-operative Plan: Extubation in OR  Informed Consent: I have reviewed the patients History and Physical, chart, labs and discussed the procedure including the risks, benefits and alternatives for the proposed anesthesia with the patient or authorized representative who has indicated his/her understanding and acceptance.     Plan Discussed with: CRNA and Surgeon  Anesthesia Plan Comments:        Anesthesia Quick Evaluation

## 2011-07-03 NOTE — Anesthesia Postprocedure Evaluation (Signed)
  Anesthesia Post Note  Patient: Marissa Maxwell  Procedure(s) Performed:  CARPAL TUNNEL RELEASE  Anesthesia type: General  Patient location: PACU  Post pain: Pain level controlled  Post assessment: Patient's Cardiovascular Status Stable  Last Vitals:  Filed Vitals:   07/03/11 0915  BP: 136/81  Pulse: 87  Temp:   Resp: 14    Post vital signs: Reviewed and stable  Level of consciousness: alert  Complications: No apparent anesthesia complications

## 2011-07-03 NOTE — Op Note (Signed)
OP NOTE DICTATED 07/03/11 [id number]

## 2011-07-04 ENCOUNTER — Encounter (HOSPITAL_BASED_OUTPATIENT_CLINIC_OR_DEPARTMENT_OTHER): Payer: Self-pay | Admitting: Orthopedic Surgery

## 2011-07-04 NOTE — H&P (Signed)
H&P documentation: 07/04/2011  -History and Physical Reviewed  -Patient has been re-examined  -No change in the plan of care  Wyn Forster, MD

## 2012-01-27 ENCOUNTER — Other Ambulatory Visit: Payer: Self-pay | Admitting: Family Medicine

## 2012-01-27 ENCOUNTER — Other Ambulatory Visit (HOSPITAL_COMMUNITY): Payer: Self-pay | Admitting: Family Medicine

## 2012-01-27 DIAGNOSIS — Z1231 Encounter for screening mammogram for malignant neoplasm of breast: Secondary | ICD-10-CM

## 2012-02-27 ENCOUNTER — Ambulatory Visit (HOSPITAL_COMMUNITY)
Admission: RE | Admit: 2012-02-27 | Discharge: 2012-02-27 | Disposition: A | Discharge status: Home / Self Care | Payer: 59 | Source: Ambulatory Visit | Attending: Family Medicine | Admitting: Family Medicine

## 2012-02-27 ENCOUNTER — Other Ambulatory Visit: Payer: Self-pay | Admitting: Family Medicine

## 2012-02-27 DIAGNOSIS — Z1231 Encounter for screening mammogram for malignant neoplasm of breast: Secondary | ICD-10-CM | POA: Insufficient documentation

## 2012-02-27 DIAGNOSIS — Z780 Asymptomatic menopausal state: Secondary | ICD-10-CM

## 2012-03-05 ENCOUNTER — Ambulatory Visit
Admission: RE | Admit: 2012-03-05 | Discharge: 2012-03-05 | Disposition: A | Discharge status: Home / Self Care | Payer: 59 | Source: Ambulatory Visit | Attending: Family Medicine | Admitting: Family Medicine

## 2012-03-05 DIAGNOSIS — Z780 Asymptomatic menopausal state: Secondary | ICD-10-CM

## 2012-03-12 DIAGNOSIS — M8580 Other specified disorders of bone density and structure, unspecified site: Secondary | ICD-10-CM | POA: Insufficient documentation

## 2012-06-04 DIAGNOSIS — K589 Irritable bowel syndrome without diarrhea: Secondary | ICD-10-CM | POA: Insufficient documentation

## 2013-02-04 DIAGNOSIS — E782 Mixed hyperlipidemia: Secondary | ICD-10-CM | POA: Insufficient documentation

## 2013-02-04 DIAGNOSIS — G4709 Other insomnia: Secondary | ICD-10-CM | POA: Insufficient documentation

## 2013-02-04 DIAGNOSIS — F988 Other specified behavioral and emotional disorders with onset usually occurring in childhood and adolescence: Secondary | ICD-10-CM | POA: Insufficient documentation

## 2013-03-17 ENCOUNTER — Other Ambulatory Visit (HOSPITAL_COMMUNITY): Payer: Self-pay | Admitting: Family Medicine

## 2013-03-17 DIAGNOSIS — Z1231 Encounter for screening mammogram for malignant neoplasm of breast: Secondary | ICD-10-CM

## 2013-03-31 ENCOUNTER — Ambulatory Visit (HOSPITAL_COMMUNITY)
Admission: RE | Admit: 2013-03-31 | Discharge: 2013-03-31 | Disposition: A | Discharge status: Home / Self Care | Payer: 59 | Source: Ambulatory Visit | Attending: Family Medicine | Admitting: Family Medicine

## 2013-03-31 DIAGNOSIS — Z1231 Encounter for screening mammogram for malignant neoplasm of breast: Secondary | ICD-10-CM | POA: Insufficient documentation

## 2014-04-30 ENCOUNTER — Encounter (HOSPITAL_COMMUNITY): Payer: Self-pay | Admitting: Emergency Medicine

## 2014-04-30 ENCOUNTER — Emergency Department (HOSPITAL_COMMUNITY): Payer: 59

## 2014-04-30 ENCOUNTER — Encounter (HOSPITAL_COMMUNITY)
Admission: EM | Disposition: A | Discharge status: Home / Self Care | Payer: Self-pay | Source: Home / Self Care | Attending: Emergency Medicine

## 2014-04-30 ENCOUNTER — Observation Stay (HOSPITAL_COMMUNITY)
Admission: EM | Admit: 2014-04-30 | Discharge: 2014-05-01 | Disposition: A | Discharge status: Home / Self Care | Payer: 59 | Attending: Urology | Admitting: Urology

## 2014-04-30 ENCOUNTER — Emergency Department (HOSPITAL_COMMUNITY): Payer: 59 | Admitting: Certified Registered"

## 2014-04-30 ENCOUNTER — Encounter (HOSPITAL_COMMUNITY): Payer: 59 | Admitting: Certified Registered"

## 2014-04-30 DIAGNOSIS — M419 Scoliosis, unspecified: Secondary | ICD-10-CM | POA: Insufficient documentation

## 2014-04-30 DIAGNOSIS — N390 Urinary tract infection, site not specified: Secondary | ICD-10-CM | POA: Diagnosis not present

## 2014-04-30 DIAGNOSIS — Z880 Allergy status to penicillin: Secondary | ICD-10-CM | POA: Insufficient documentation

## 2014-04-30 DIAGNOSIS — N1330 Unspecified hydronephrosis: Secondary | ICD-10-CM | POA: Insufficient documentation

## 2014-04-30 DIAGNOSIS — F329 Major depressive disorder, single episode, unspecified: Secondary | ICD-10-CM | POA: Diagnosis not present

## 2014-04-30 DIAGNOSIS — Z87891 Personal history of nicotine dependence: Secondary | ICD-10-CM | POA: Diagnosis not present

## 2014-04-30 DIAGNOSIS — N202 Calculus of kidney with calculus of ureter: Secondary | ICD-10-CM | POA: Diagnosis present

## 2014-04-30 DIAGNOSIS — K219 Gastro-esophageal reflux disease without esophagitis: Secondary | ICD-10-CM | POA: Insufficient documentation

## 2014-04-30 DIAGNOSIS — F419 Anxiety disorder, unspecified: Secondary | ICD-10-CM | POA: Insufficient documentation

## 2014-04-30 DIAGNOSIS — N201 Calculus of ureter: Secondary | ICD-10-CM | POA: Diagnosis present

## 2014-04-30 DIAGNOSIS — N200 Calculus of kidney: Secondary | ICD-10-CM

## 2014-04-30 HISTORY — PX: CYSTOSCOPY W/ URETERAL STENT PLACEMENT: SHX1429

## 2014-04-30 LAB — CBC WITH DIFFERENTIAL/PLATELET
BASOS ABS: 0 10*3/uL (ref 0.0–0.1)
BASOS PCT: 0 % (ref 0–1)
Eosinophils Absolute: 0.1 10*3/uL (ref 0.0–0.7)
Eosinophils Relative: 1 % (ref 0–5)
HCT: 35.5 % — ABNORMAL LOW (ref 36.0–46.0)
Hemoglobin: 11.6 g/dL — ABNORMAL LOW (ref 12.0–15.0)
LYMPHS PCT: 16 % (ref 12–46)
Lymphs Abs: 2.5 10*3/uL (ref 0.7–4.0)
MCH: 31.4 pg (ref 26.0–34.0)
MCHC: 32.7 g/dL (ref 30.0–36.0)
MCV: 95.9 fL (ref 78.0–100.0)
Monocytes Absolute: 1.6 10*3/uL — ABNORMAL HIGH (ref 0.1–1.0)
Monocytes Relative: 10 % (ref 3–12)
NEUTROS ABS: 11.9 10*3/uL — AB (ref 1.7–7.7)
Neutrophils Relative %: 73 % (ref 43–77)
PLATELETS: 307 10*3/uL (ref 150–400)
RBC: 3.7 MIL/uL — ABNORMAL LOW (ref 3.87–5.11)
RDW: 13.3 % (ref 11.5–15.5)
WBC: 16.3 10*3/uL — AB (ref 4.0–10.5)

## 2014-04-30 LAB — URINALYSIS, ROUTINE W REFLEX MICROSCOPIC
Bilirubin Urine: NEGATIVE
Glucose, UA: NEGATIVE mg/dL
KETONES UR: NEGATIVE mg/dL
NITRITE: NEGATIVE
PH: 5 (ref 5.0–8.0)
Protein, ur: NEGATIVE mg/dL
SPECIFIC GRAVITY, URINE: 1.014 (ref 1.005–1.030)
UROBILINOGEN UA: 0.2 mg/dL (ref 0.0–1.0)

## 2014-04-30 LAB — BASIC METABOLIC PANEL
ANION GAP: 13 (ref 5–15)
BUN: 21 mg/dL (ref 6–23)
CALCIUM: 8.9 mg/dL (ref 8.4–10.5)
CHLORIDE: 102 meq/L (ref 96–112)
CO2: 23 meq/L (ref 19–32)
Creatinine, Ser: 1.02 mg/dL (ref 0.50–1.10)
GFR calc non Af Amer: 61 mL/min — ABNORMAL LOW (ref >90)
GFR, EST AFRICAN AMERICAN: 70 mL/min — AB (ref >90)
Glucose, Bld: 126 mg/dL — ABNORMAL HIGH (ref 70–99)
Potassium: 4.1 mEq/L (ref 3.7–5.3)
SODIUM: 138 meq/L (ref 137–147)

## 2014-04-30 LAB — URINE MICROSCOPIC-ADD ON

## 2014-04-30 SURGERY — CYSTOSCOPY, WITH RETROGRADE PYELOGRAM AND URETERAL STENT INSERTION
Anesthesia: General | Site: Ureter | Laterality: Left

## 2014-04-30 MED ORDER — CIPROFLOXACIN HCL 500 MG PO TABS
500.0000 mg | ORAL_TABLET | Freq: Two times a day (BID) | ORAL | Status: DC
  Administered 2014-04-30 – 2014-05-01 (×3): 500 mg via ORAL
  Filled 2014-04-30 (×5): qty 1

## 2014-04-30 MED ORDER — KETOROLAC TROMETHAMINE 30 MG/ML IJ SOLN: 15.0000 mg | Freq: Once | INTRAMUSCULAR | Status: DC | PRN

## 2014-04-30 MED ORDER — OXYBUTYNIN CHLORIDE 5 MG PO TABS
5.0000 mg | ORAL_TABLET | Freq: Three times a day (TID) | ORAL | Status: DC | PRN
  Filled 2014-04-30: qty 1

## 2014-04-30 MED ORDER — PROPOFOL 10 MG/ML IV BOLUS
INTRAVENOUS | Status: AC
  Filled 2014-04-30: qty 20

## 2014-04-30 MED ORDER — LORATADINE 10 MG PO TABS
10.0000 mg | ORAL_TABLET | Freq: Every day | ORAL | Status: DC
  Administered 2014-04-30 – 2014-05-01 (×2): 10 mg via ORAL
  Filled 2014-04-30 (×2): qty 1

## 2014-04-30 MED ORDER — ONDANSETRON HCL 4 MG PO TABS: 4.0000 mg | ORAL_TABLET | Freq: Three times a day (TID) | ORAL | Status: DC | PRN

## 2014-04-30 MED ORDER — SODIUM CHLORIDE 0.9 % IR SOLN
Status: DC | PRN
  Administered 2014-04-30: 3000 mL

## 2014-04-30 MED ORDER — DICLOFENAC SODIUM 50 MG PO TBEC
50.0000 mg | DELAYED_RELEASE_TABLET | Freq: Every day | ORAL | Status: DC
  Administered 2014-04-30 – 2014-05-01 (×2): 50 mg via ORAL
  Filled 2014-04-30 (×2): qty 1

## 2014-04-30 MED ORDER — LACTATED RINGERS IV SOLN
INTRAVENOUS | Status: DC | PRN
  Administered 2014-04-30: 08:00:00 via INTRAVENOUS

## 2014-04-30 MED ORDER — MIDAZOLAM HCL 2 MG/2ML IJ SOLN
INTRAMUSCULAR | Status: AC
  Filled 2014-04-30: qty 2

## 2014-04-30 MED ORDER — SODIUM CHLORIDE 0.9 % IV BOLUS (SEPSIS)
1000.0000 mL | Freq: Once | INTRAVENOUS | Status: AC
  Administered 2014-04-30: 1000 mL via INTRAVENOUS

## 2014-04-30 MED ORDER — AMITRIPTYLINE HCL 50 MG PO TABS
50.0000 mg | ORAL_TABLET | Freq: Every day | ORAL | Status: DC
  Administered 2014-04-30: 50 mg via ORAL
  Filled 2014-04-30 (×2): qty 1

## 2014-04-30 MED ORDER — SUMATRIPTAN SUCCINATE 50 MG PO TABS
50.0000 mg | ORAL_TABLET | ORAL | Status: DC | PRN
  Administered 2014-04-30 (×2): 50 mg via ORAL
  Filled 2014-04-30 (×3): qty 1

## 2014-04-30 MED ORDER — PROMETHAZINE HCL 25 MG/ML IJ SOLN
12.5000 mg | Freq: Once | INTRAMUSCULAR | Status: AC
  Administered 2014-04-30: 12.5 mg via INTRAVENOUS
  Filled 2014-04-30: qty 1

## 2014-04-30 MED ORDER — PANTOPRAZOLE SODIUM 40 MG PO TBEC
40.0000 mg | DELAYED_RELEASE_TABLET | Freq: Every day | ORAL | Status: DC
  Administered 2014-04-30 – 2014-05-01 (×2): 40 mg via ORAL
  Filled 2014-04-30 (×2): qty 1

## 2014-04-30 MED ORDER — LIDOCAINE HCL (CARDIAC) 20 MG/ML IV SOLN
INTRAVENOUS | Status: AC
  Filled 2014-04-30: qty 5

## 2014-04-30 MED ORDER — FENTANYL CITRATE 0.05 MG/ML IJ SOLN
INTRAMUSCULAR | Status: AC
  Filled 2014-04-30: qty 2

## 2014-04-30 MED ORDER — DEXTROSE 5 % IV SOLN
300.0000 mg | INTRAVENOUS | Status: DC
  Administered 2014-04-30: 300 mg via INTRAVENOUS
  Filled 2014-04-30: qty 7.5

## 2014-04-30 MED ORDER — ACETAMINOPHEN 325 MG PO TABS: 650.0000 mg | ORAL_TABLET | ORAL | Status: DC | PRN

## 2014-04-30 MED ORDER — MIDAZOLAM HCL 5 MG/5ML IJ SOLN
INTRAMUSCULAR | Status: DC | PRN
  Administered 2014-04-30: 2 mg via INTRAVENOUS

## 2014-04-30 MED ORDER — MEPERIDINE HCL 50 MG/ML IJ SOLN: 6.2500 mg | INTRAMUSCULAR | Status: DC | PRN

## 2014-04-30 MED ORDER — CYCLOBENZAPRINE HCL 10 MG PO TABS
10.0000 mg | ORAL_TABLET | Freq: Three times a day (TID) | ORAL | Status: DC | PRN
  Filled 2014-04-30: qty 1

## 2014-04-30 MED ORDER — HYDROMORPHONE HCL 1 MG/ML IJ SOLN: 0.2500 mg | INTRAMUSCULAR | Status: DC | PRN

## 2014-04-30 MED ORDER — LACTATED RINGERS IV SOLN: INTRAVENOUS | Status: DC

## 2014-04-30 MED ORDER — MORPHINE SULFATE 4 MG/ML IJ SOLN
4.0000 mg | Freq: Once | INTRAMUSCULAR | Status: AC
  Administered 2014-04-30: 4 mg via INTRAVENOUS
  Filled 2014-04-30: qty 1

## 2014-04-30 MED ORDER — PHENYLEPHRINE 40 MCG/ML (10ML) SYRINGE FOR IV PUSH (FOR BLOOD PRESSURE SUPPORT)
PREFILLED_SYRINGE | INTRAVENOUS | Status: AC
  Filled 2014-04-30: qty 10

## 2014-04-30 MED ORDER — ONDANSETRON HCL 4 MG/2ML IJ SOLN
4.0000 mg | Freq: Once | INTRAMUSCULAR | Status: AC
  Administered 2014-04-30: 4 mg via INTRAVENOUS
  Filled 2014-04-30: qty 2

## 2014-04-30 MED ORDER — OXYCODONE-ACETAMINOPHEN 5-325 MG PO TABS: 1.0000 | ORAL_TABLET | ORAL | Status: DC | PRN

## 2014-04-30 MED ORDER — AMPHETAMINE-DEXTROAMPHET ER 20 MG PO CP24: 20.0000 mg | ORAL_CAPSULE | Freq: Every day | ORAL | Status: DC

## 2014-04-30 MED ORDER — PROPOFOL 10 MG/ML IV BOLUS
INTRAVENOUS | Status: DC | PRN
  Administered 2014-04-30: 160 mg via INTRAVENOUS

## 2014-04-30 MED ORDER — ONDANSETRON HCL 4 MG/2ML IJ SOLN: 4.0000 mg | INTRAMUSCULAR | Status: DC | PRN

## 2014-04-30 MED ORDER — LIDOCAINE HCL (CARDIAC) 20 MG/ML IV SOLN
INTRAVENOUS | Status: DC | PRN
  Administered 2014-04-30: 50 mg via INTRAVENOUS

## 2014-04-30 MED ORDER — PHENYLEPHRINE HCL 10 MG/ML IJ SOLN
INTRAMUSCULAR | Status: DC | PRN
  Administered 2014-04-30 (×3): 40 ug via INTRAVENOUS

## 2014-04-30 MED ORDER — AMPHETAMINE-DEXTROAMPHET ER 20 MG PO CP24
20.0000 mg | ORAL_CAPSULE | Freq: Every day | ORAL | Status: DC
  Administered 2014-04-30: 20 mg via ORAL
  Filled 2014-04-30: qty 1

## 2014-04-30 MED ORDER — FENTANYL CITRATE 0.05 MG/ML IJ SOLN
INTRAMUSCULAR | Status: DC | PRN
  Administered 2014-04-30 (×2): 50 ug via INTRAVENOUS

## 2014-04-30 MED ORDER — PROMETHAZINE HCL 25 MG/ML IJ SOLN: 6.2500 mg | INTRAMUSCULAR | Status: DC | PRN

## 2014-04-30 MED ORDER — HYDROMORPHONE HCL 1 MG/ML IJ SOLN
0.5000 mg | INTRAMUSCULAR | Status: DC | PRN
  Administered 2014-04-30: 0.5 mg via INTRAVENOUS
  Filled 2014-04-30: qty 1

## 2014-04-30 MED ORDER — DOCUSATE SODIUM 100 MG PO CAPS
100.0000 mg | ORAL_CAPSULE | Freq: Two times a day (BID) | ORAL | Status: DC
  Administered 2014-04-30 – 2014-05-01 (×3): 100 mg via ORAL
  Filled 2014-04-30 (×4): qty 1

## 2014-04-30 MED ORDER — DEXTROSE-NACL 5-0.45 % IV SOLN
INTRAVENOUS | Status: DC
  Administered 2014-04-30 (×2): via INTRAVENOUS

## 2014-04-30 MED ORDER — ENOXAPARIN SODIUM 40 MG/0.4ML ~~LOC~~ SOLN
40.0000 mg | SUBCUTANEOUS | Status: DC
  Administered 2014-04-30: 40 mg via SUBCUTANEOUS
  Filled 2014-04-30 (×2): qty 0.4

## 2014-04-30 MED ORDER — PRAVASTATIN SODIUM 10 MG PO TABS
10.0000 mg | ORAL_TABLET | Freq: Every day | ORAL | Status: DC
  Administered 2014-04-30: 10 mg via ORAL
  Filled 2014-04-30 (×2): qty 1

## 2014-04-30 SURGICAL SUPPLY — 12 items
BAG URO CATCHER STRL LF (DRAPE) ×2 IMPLANT
CATH INTERMIT  6FR 70CM (CATHETERS) ×2 IMPLANT
CLOTH BEACON ORANGE TIMEOUT ST (SAFETY) ×2 IMPLANT
DRAPE CAMERA CLOSED 9X96 (DRAPES) ×2 IMPLANT
GLOVE BIOGEL M 8.0 STRL (GLOVE) ×2 IMPLANT
GOWN STRL REUS W/ TWL XL LVL3 (GOWN DISPOSABLE) ×1 IMPLANT
GOWN STRL REUS W/TWL XL LVL3 (GOWN DISPOSABLE) ×3 IMPLANT
GUIDEWIRE STR DUAL SENSOR (WIRE) ×2 IMPLANT
MANIFOLD NEPTUNE II (INSTRUMENTS) ×2 IMPLANT
PACK CYSTO (CUSTOM PROCEDURE TRAY) ×2 IMPLANT
STENT CONTOUR 6FRX24X.038 (STENTS) ×2 IMPLANT
TUBING CONNECTING 10 (TUBING) ×2 IMPLANT

## 2014-04-30 NOTE — Transfer of Care (Signed)
Immediate Anesthesia Transfer of Care Note  Patient: Marissa Maxwell  Procedure(s) Performed: Procedure(s): CYSTOSCOPY WITH LEFT URETERAL STENT PLACEMENT (Left)  Patient Location: PACU  Anesthesia Type:General  Level of Consciousness: awake, alert  and oriented  Airway & Oxygen Therapy: Patient Spontanous Breathing and Patient connected to face mask oxygen  Post-op Assessment: Report given to PACU RN and Post -op Vital signs reviewed and stable  Post vital signs: Reviewed and stable  Complications: No apparent anesthesia complications

## 2014-04-30 NOTE — ED Provider Notes (Signed)
CSN: [id number]     Arrival date & time 04/30/14  0112 History   First MD Initiated Contact with Patient 04/30/14 0146     Chief Complaint  Patient presents with  . Flank Pain     (Consider location/radiation/quality/duration/timing/severity/associated sxs/prior Treatment) HPI  This is a 53 year old female who presents with worsening left flank pain. Patient reports that she had onset of left flank pain yesterday while in [place]. She was evaluated at an urgent care facility and found to have a 0.7 cm kidney stone. She was also noted to have a urinary tract infection. She was placed on ciprofloxacin, oxycodone, Zofran, and Flomax. She has not had any of her imaging or lab results with her; however, discharge summary indicates these diagnoses.  Patient denies any fevers. Pain is left-sided and radiates downward. Currently pain is 5/10. Patient's states that her pain was 8/10 but she took 2 oxycodone before she came.   Past Medical History  Diagnosis Date  . Scoliosis      two herrington rods  . GERD (gastroesophageal reflux disease)   . Anxiety   . Depression    Past Surgical History  Procedure Laterality Date  . Abdominal hysterectomy    . Lap choli    . Tonsillectomy    . Back surgery    . Carpal tunnel release  07/03/2011    Procedure: CARPAL TUNNEL RELEASE;  Surgeon: Wyn Forsterobert V Sypher Jr., MD;  Location: [facility];  Service: Orthopedics;  Laterality: Right;   No family history on file. History  Substance Use Topics  . Smoking status: Former Games developermoker  . Smokeless tobacco: Not on file  . Alcohol Use: No   OB History   Grav Para Term Preterm Abortions TAB SAB Ect Mult Living                 Review of Systems  Constitutional: Negative for fever.  Respiratory: Negative for chest tightness and shortness of breath.   Cardiovascular: Negative for chest pain.  Gastrointestinal: Positive for nausea. Negative for vomiting and abdominal pain.  Genitourinary:  Positive for flank pain. Negative for dysuria and hematuria.  Neurological: Negative for headaches.  All other systems reviewed and are negative.     Allergies  Penicillins  Home Medications   Prior to Admission medications   Medication Sig Start Date End Date Taking? Authorizing Provider  fish oil-omega-3 fatty acids 1000 MG capsule Take 2 g by mouth daily.      Historical Provider, MD  Multiple Vitamin (MULTIVITAMIN) capsule Take 1 capsule by mouth daily.      Historical Provider, MD  Nutritional Supplements (ESTROVEN PO) Take by mouth.      Historical Provider, MD   BP 112/63  Pulse 88  Temp(Src) 99.5 F (37.5 C) (Oral)  Resp 16  Wt 155 lb (70.308 kg)  SpO2 92% Physical Exam  Nursing note and vitals reviewed. Constitutional: She is oriented to person, place, and time. She appears well-developed and well-nourished.  Uncomfortable appearing  HENT:  Head: Normocephalic and atraumatic.  Cardiovascular: Normal rate, regular rhythm and normal heart sounds.   Pulmonary/Chest: Effort normal. No respiratory distress.  Abdominal: Soft. Bowel sounds are normal. There is no tenderness.  Genitourinary:  No CVA tenderness  Neurological: She is alert and oriented to person, place, and time.  Skin: Skin is warm and dry.  Psychiatric: She has a normal mood and affect.    ED Course  Procedures (including critical care time) Labs Review Labs  Reviewed  CBC WITH DIFFERENTIAL - Abnormal; Notable for the following:    WBC 16.3 (*)    RBC 3.70 (*)    Hemoglobin 11.6 (*)    HCT 35.5 (*)    Neutro Abs 11.9 (*)    Monocytes Absolute 1.6 (*)    All other components within normal limits  BASIC METABOLIC PANEL - Abnormal; Notable for the following:    Glucose, Bld 126 (*)    GFR calc non Af Amer 61 (*)    GFR calc Af Amer 70 (*)    All other components within normal limits  URINALYSIS, ROUTINE W REFLEX MICROSCOPIC - Abnormal; Notable for the following:    APPearance CLOUDY (*)    Hgb  urine dipstick LARGE (*)    Leukocytes, UA SMALL (*)    All other components within normal limits  URINE MICROSCOPIC-ADD ON - Abnormal; Notable for the following:    Bacteria, UA FEW (*)    All other components within normal limits    Imaging Review Dg Abd 1 View  04/30/2014   CLINICAL DATA:  Acute onset of left-sided flank pain.  EXAM: ABDOMEN - 1 VIEW  COMPARISON:  Renal ultrasound performed earlier today at 2:53 a.m.  FINDINGS: There appears to be a 1.0 cm stone overlying the proximal left ureter, likely about the left ureteropelvic junction. The presence of a left-sided ureteral jet on recent ultrasound may reflect intermittent obstruction due to the position of the stone.  The visualized bowel gas pattern is grossly unremarkable. No free intra-abdominal air is identified, though evaluation for free air is limited on a single supine view. A moderate amount of stool is noted along the ascending colon. Postoperative change is noted at the right upper quadrant, reflecting prior cholecystectomy. Thoracolumbar spinal fusion hardware is partially imaged. No acute osseous abnormalities are seen.  IMPRESSION: 1.0 cm stone noted overlying the proximal left ureter, likely about the left ureteropelvic junction. The presence of a left-sided ureteral jet on recent ultrasound may reflect intermittent obstruction due to the position of the stone.   Electronically Signed   By: Roanna Raider M.D.   On: 04/30/2014 05:53   US Renal  04/30/2014   CLINICAL DATA:  Acute onset of left flank pain.  Initial encounter.  EXAM: RENAL/URINARY TRACT ULTRASOUND COMPLETE  COMPARISON:  Abdominal ultrasound performed 07/16/2009  FINDINGS: Right Kidney:  Length: 10.1 cm. Echogenicity within normal limits. No mass or hydronephrosis visualized.  Left Kidney:  Length: 10.8 cm. Echogenicity within normal limits. Mild to moderate left-sided hydronephrosis is noted.  Bladder:  Appears normal for degree of bladder distention. Bilateral  ureteral jets are visualized.  IMPRESSION: Mild to moderate left-sided hydronephrosis noted, raising concern for distal obstruction, though bilateral ureteral jets are seen. This may reflect an intermittently obstructing stone.   Electronically Signed   By: Roanna Raider M.D.   On: 04/30/2014 03:44     EKG Interpretation None      MDM   Final diagnoses:  None    Patient presents with diagnosis of kidney stones with continued pain. She is uncomfortable but nontoxic on exam. Initial vital signs notable for temp of 99.5 pulse 15. Patient was given fluids, Zofran, and morphine. Basic labwork obtained. Notable for white count of 16.5.  Patient also has evidence of 11-20 white cells and few bacteria in her urine. She's been taking Cipro. Renal ultrasounds shows left-sided hydronephrosis without visualizing the stone. Discussed with Dr. Retta Diones on call. Concern for infected kidney stone  that is greater than 0.6 cm. He requests KUB. He will evaluate the patient for possible stent placement. KUB shows approximately 1 cm stone. Urology evaluation pending.    Shon Batonourtney F Horton, MD 04/30/14 [phone]

## 2014-04-30 NOTE — ED Notes (Signed)
Pt arrived to the ED with a complaint of left sided flank pain.  Pt states she was in [place] and seen in ED for same and was told she had a 7mm stone.  Pt was given antibiotics as well for a UTI.  Pt states pain has not been controlled by medication.

## 2014-04-30 NOTE — Op Note (Signed)
Preoperative diagnosis:  1. Left proximal ureteral stone a possible UTI  Postoperative diagnosis:  1. Same   Procedure:  1. Cystoscopy 2. Left ureteral stent placement (24 cm x 6 [place] contour without string)    Surgeon: Bertram MillardStephen M. Ranyia Witting  M.D.  Anesthesia: General  Complications: None  Intraoperative findings: Normal bladder  EBL: Minimal  Specimens: None  Indication: Marissa CourserCindy O Check is a 53 y.o. patient with a 7 x 10 mm proximal left ureteral stone with possible UTI. Please see admission history for full recent history as well as physical exam.   After reviewing the management options for treatment, he/she elected to proceed with the above surgical procedure(s). We have discussed the potential benefits and risks of the procedure, side effects of the proposed treatment, the likelihood of the patient achieving the goals of the procedure, and any potential problems that might occur during the procedure or recuperation. Informed consent has been obtained.  Description of procedure:  The patient was taken to the operating room and general anesthesia was induced.  The patient was placed in the dorsal lithotomy position, prepped and draped in the usual sterile fashion, and preoperative antibiotics were administered. A preoperative time-out was performed.   Cystourethroscopy was performed. The bladder was normal. Ureteral orifices were normal in configuration and location.  A 0.38 sensor guidewire was then advanced up the left ureter into the renal pelvis under fluoroscopic guidance.  The wire was then backloaded through the cystoscope and a ureteral stent was advance over the wire using Seldinger technique.  The stent was positioned appropriately under fluoroscopic and cystoscopic guidance.  The wire was then removed with an adequate stent curl noted in the renal pelvis as well as in the bladder.  The bladder was then emptied and the procedure ended.  The patient appeared to  tolerate the procedure well and without complications.  The patient was able to be awakened and transferred to the recovery unit in satisfactory condition.    Bertram MillardStephen M. Retta Dionesahlstedt, MD

## 2014-04-30 NOTE — Discharge Instructions (Signed)

## 2014-04-30 NOTE — ED Notes (Signed)
She is awake and alert and in no distress.  Will transport to O.R. Shortly.

## 2014-04-30 NOTE — ED Notes (Signed)
US at bedside

## 2014-04-30 NOTE — Anesthesia Preprocedure Evaluation (Signed)
Anesthesia Evaluation  Patient identified by MRN, date of birth, ID band Patient awake    Reviewed: Allergy & Precautions, H&P , NPO status , Patient's Chart, lab work & pertinent test results  Airway Mallampati: II TM Distance: >3 FB Neck ROM: full    Dental no notable dental hx.    Pulmonary former smoker,    Pulmonary exam normal       Cardiovascular Exercise Tolerance: Good negative cardio ROS      Neuro/Psych negative neurological ROS     GI/Hepatic Neg liver ROS, GERD-  Medicated and Controlled,  Endo/Other  negative endocrine ROS  Renal/GU negative Renal ROS  negative genitourinary   Musculoskeletal   Abdominal Normal abdominal exam  (+)   Peds  Hematology negative hematology ROS (+)   Anesthesia Other Findings   Reproductive/Obstetrics negative OB ROS                           Anesthesia Physical Anesthesia Plan  ASA: II and emergent  Anesthesia Plan: General   Post-op Pain Management:    Induction:   Airway Management Planned: LMA  Additional Equipment:   Intra-op Plan:   Post-operative Plan:   Informed Consent: I have reviewed the patients History and Physical, chart, labs and discussed the procedure including the risks, benefits and alternatives for the proposed anesthesia with the patient or authorized representative who has indicated his/her understanding and acceptance.     Plan Discussed with: CRNA and Surgeon  Anesthesia Plan Comments:         Anesthesia Quick Evaluation

## 2014-04-30 NOTE — Progress Notes (Signed)
ANTIBIOTIC CONSULT NOTE - INITIAL  Pharmacy Consult for Gentamicin Indication: UTI  Allergies  Allergen Reactions  . Penicillins Rash    Patient Measurements: Weight: 155 lb (70.308 kg) Adjusted Body Weight: 59 kg  Vital Signs: Temp: 99.5 F (37.5 C) (10/11 0115) Temp Source: Oral (10/11 0115) BP: 138/82 mmHg (10/11 0655) Pulse Rate: 86 (10/11 0655)   Labs:  Recent Labs  04/30/14 0155  WBC 16.3*  HGB 11.6*  PLT 307  CREATININE 1.02    Microbiology: No results found for this or any previous visit (from the past 720 hour(s)).  Medical History: Past Medical History  Diagnosis Date  . Scoliosis      two herrington rods  . GERD (gastroesophageal reflux disease)   . Anxiety   . Depression     Medications:  Scheduled:  . promethazine  12.5 mg Intravenous Once    Assessment: . 53 yo female with chief complaint of flank pain which started 2 nights ago. She was seen in [place] emergency room [place], [place] scan showed kidney stone and sent home with Cipro for mild UTI. Pain is worse with shakes and chills. Gentamicin is to be started for UTI. Scr=1.02 with ClCr (CG 58, N=70). MD plans to have cystoscopy and stent placement for kidney stone today.  Goal of Therapy:  .Eradication of infection .Gentamicin trough of <1 mcg/ml  Plan:  . Will start Gentamicin 300mg  (5 mg/kg) Q24 . Will check 10 hr Gent random level to assess the appropriateness of frequency.  Dorethea ClanFrens, Vung Kush Ann 04/30/2014,7:11 AM

## 2014-04-30 NOTE — Anesthesia Postprocedure Evaluation (Signed)
Anesthesia Post Note  Patient: Marissa Maxwell  Procedure(s) Performed: Procedure(s) (LRB): CYSTOSCOPY WITH LEFT URETERAL STENT PLACEMENT (Left)  Anesthesia type: General  Patient location: PACU  Post pain: Pain level controlled  Post assessment: Post-op Vital signs reviewed  Last Vitals:  Filed Vitals:   04/30/14 0900  BP: 108/64  Pulse: 86  Temp:   Resp: 16    Post vital signs: Reviewed  Level of consciousness: sedated  Complications: No apparent anesthesia complications

## 2014-04-30 NOTE — Progress Notes (Signed)
Patient admitted from PACU post cysto, alert and oriented, denies pain/disress but sleepy. Oriented patient to room/unit/hospital and reviewed plan of care with patient/family. No wound noted, skin intact. Will continue to F/U with plan of care.

## 2014-04-30 NOTE — Consult Note (Signed)
Urology Consult   Physician requesting consult: Horton  Reason for consult: Kidney stone  History of Present Illness: Marissa Maxwell is a 53 y.o. female who presented to the emergency room [facility] at [facility] a few hours ago for follow up with a kidney stone, shakes and chills. She began having left flank pain 2 nights ago. It was felt to be a kidney stone, although she has never had one. She went to an emergency room in [place] where CT scan was performed. This revealed a 7 mm kidney stone. She also apparently was told she had a mild urinary tract infection and was sent home with Cipro. Her pain got worse last night, she had shakes and chills, and she subsequently [place] back to [place]. She presented here to the emergency room. Ultrasound revealed left hydronephrosis. She was found to have pyuria, although not severe, as well as leukocytosis. She has not had a fever in the emergency room. She has required pain medicine. Urologic   She consultation is requested for further management. denies a history of voiding or storage urinary symptoms, hematuria, UTIs, STDs, urolithiasis, GU malignancy/trauma/surgery.  Past Medical History  Diagnosis Date  . Scoliosis      two herrington rods  . GERD (gastroesophageal reflux disease)   . Anxiety   . Depression     Past Surgical History  Procedure Laterality Date  . Abdominal hysterectomy    . Lap choli    . Tonsillectomy    . Back surgery    . Carpal tunnel release  07/03/2011    Procedure: CARPAL TUNNEL RELEASE;  Surgeon: Wyn Forsterobert V Sypher Jr., MD;  Location: [facility];  Service: Orthopedics;  Laterality: Right;     Current Hospital Medications: Scheduled Meds: Continuous Infusions: PRN Meds:.    Allergies:  Allergies  Allergen Reactions  . Penicillins Rash    No family history on file.  Social History:  reports that she has quit smoking. She does not have any smokeless tobacco  history on file. She reports that she does not drink alcohol or use illicit drugs.  ROS: A complete review of systems was performed.  All systems are negative except for pertinent findings as noted. she has had nausea but no vomiting. She has not had gross hematuria.   Physical Exam:  Vital signs in last 24 hours: Temp:  [99.5 F (37.5 C)] 99.5 F (37.5 C) (10/11 0115) Pulse Rate:  [88-105] 88 (10/11 0436) Resp:  [16-20] 16 (10/11 0436) BP: (112-125)/(63-73) 112/63 mmHg (10/11 0436) SpO2:  [92 %-96 %] 92 % (10/11 0436) Weight:  [70.308 kg (155 lb)] 70.308 kg (155 lb) (10/11 0115) General:  Alert and oriented, No acute distress HEENT: Normocephalic, atraumatic Neck: No JVD or lymphadenopathy Cardiovascular: Regular rate and rhythm Lungs: Clear bilaterally Abdomen: Soft,  minimal left CVA and left lower quadrant tenderness. No rebound or guarding. No masses.  Back: No CVA tenderness Extremities: No edema Neurologic: Grossly intact  Laboratory Data:   Recent Labs  04/30/14 0155  WBC 16.3*  HGB 11.6*  HCT 35.5*  PLT 307     Recent Labs  04/30/14 0155  NA 138  K 4.1  CL 102  GLUCOSE 126*  BUN 21  CALCIUM 8.9  CREATININE 1.02     Results for orders placed during the hospital encounter of 04/30/14 (from the past 24 hour(s))  CBC WITH DIFFERENTIAL     Status: Abnormal   Collection Time  04/30/14  1:55 AM      Result Value Ref Range   WBC 16.3 (*) 4.0 - 10.5 K/uL   RBC 3.70 (*) 3.87 - 5.11 MIL/uL   Hemoglobin 11.6 (*) 12.0 - 15.0 g/dL   HCT 96.0 (*) 45.4 - 09.8 %   MCV 95.9  78.0 - 100.0 fL   MCH 31.4  26.0 - 34.0 pg   MCHC 32.7  30.0 - 36.0 g/dL   RDW 11.9  14.7 - 82.9 %   Platelets 307  150 - 400 K/uL   Neutrophils Relative % 73  43 - 77 %   Neutro Abs 11.9 (*) 1.7 - 7.7 K/uL   Lymphocytes Relative 16  12 - 46 %   Lymphs Abs 2.5  0.7 - 4.0 K/uL   Monocytes Relative 10  3 - 12 %   Monocytes Absolute 1.6 (*) 0.1 - 1.0 K/uL   Eosinophils Relative 1  0 - 5  %   Eosinophils Absolute 0.1  0.0 - 0.7 K/uL   Basophils Relative 0  0 - 1 %   Basophils Absolute 0.0  0.0 - 0.1 K/uL  BASIC METABOLIC PANEL     Status: Abnormal   Collection Time    04/30/14  1:55 AM      Result Value Ref Range   Sodium 138  137 - 147 mEq/L   Potassium 4.1  3.7 - 5.3 mEq/L   Chloride 102  96 - 112 mEq/L   CO2 23  19 - 32 mEq/L   Glucose, Bld 126 (*) 70 - 99 mg/dL   BUN 21  6 - 23 mg/dL   Creatinine, Ser 5.62  0.50 - 1.10 mg/dL   Calcium 8.9  8.4 - 13.0 mg/dL   GFR calc non Af Amer 61 (*) >90 mL/min   GFR calc Af Amer 70 (*) >90 mL/min   Anion gap 13  5 - 15  URINALYSIS, ROUTINE W REFLEX MICROSCOPIC     Status: Abnormal   Collection Time    04/30/14  2:05 AM      Result Value Ref Range   Color, Urine YELLOW  YELLOW   APPearance CLOUDY (*) CLEAR   Specific Gravity, Urine 1.014  1.005 - 1.030   pH 5.0  5.0 - 8.0   Glucose, UA NEGATIVE  NEGATIVE mg/dL   Hgb urine dipstick LARGE (*) NEGATIVE   Bilirubin Urine NEGATIVE  NEGATIVE   Ketones, ur NEGATIVE  NEGATIVE mg/dL   Protein, ur NEGATIVE  NEGATIVE mg/dL   Urobilinogen, UA 0.2  0.0 - 1.0 mg/dL   Nitrite NEGATIVE  NEGATIVE   Leukocytes, UA SMALL (*) NEGATIVE  URINE MICROSCOPIC-ADD ON     Status: Abnormal   Collection Time    04/30/14  2:05 AM      Result Value Ref Range   WBC, UA 11-20  <3 WBC/hpf   RBC / HPF 11-20  <3 RBC/hpf   Bacteria, UA FEW (*) RARE   No results found for this or any previous visit (from the past 240 hour(s)).  Renal Function:  Recent Labs  04/30/14 0155  CREATININE 1.02   The CrCl is unknown because both a height and weight (above a minimum accepted value) are required for this calculation.  Radiologic Imaging: Dg Abd 1 View  04/30/2014   CLINICAL DATA:  Acute onset of left-sided flank pain.  EXAM: ABDOMEN - 1 VIEW  COMPARISON:  Renal ultrasound performed earlier today at 2:53 a.m.  FINDINGS: There appears  to be a 1.0 cm stone overlying the proximal left ureter, likely about  the left ureteropelvic junction. The presence of a left-sided ureteral jet on recent ultrasound may reflect intermittent obstruction due to the position of the stone.  The visualized bowel gas pattern is grossly unremarkable. No free intra-abdominal air is identified, though evaluation for free air is limited on a single supine view. A moderate amount of stool is noted along the ascending colon. Postoperative change is noted at the right upper quadrant, reflecting prior cholecystectomy. Thoracolumbar spinal fusion hardware is partially imaged. No acute osseous abnormalities are seen.  IMPRESSION: 1.0 cm stone noted overlying the proximal left ureter, likely about the left ureteropelvic junction. The presence of a left-sided ureteral jet on recent ultrasound may reflect intermittent obstruction due to the position of the stone.   Electronically Signed   By: Roanna RaiderJeffery  Chang M.D.   On: 04/30/2014 05:53   [place] Renal  04/30/2014   CLINICAL DATA:  Acute onset of left flank pain.  Initial encounter.  EXAM: RENAL/URINARY TRACT ULTRASOUND COMPLETE  COMPARISON:  Abdominal ultrasound performed 07/16/2009  FINDINGS: Right Kidney:  Length: 10.1 cm. Echogenicity within normal limits. No mass or hydronephrosis visualized.  Left Kidney:  Length: 10.8 cm. Echogenicity within normal limits. Mild to moderate left-sided hydronephrosis is noted.  Bladder:  Appears normal for degree of bladder distention. Bilateral ureteral jets are visualized.  IMPRESSION: Mild to moderate left-sided hydronephrosis noted, raising concern for distal obstruction, though bilateral ureteral jets are seen. This may reflect an intermittently obstructing stone.   Electronically Signed   By: Roanna RaiderJeffery  Chang M.D.   On: 04/30/2014 03:44    I independently reviewed the above imaging studies.  Impression/Assessment:   7 x 10 mm left UPJ stone. This is accompanied with a probable urinary tract infection.  Plan:   I've discuss further management with the  patient. Certainly, I don't think she's going to pass the stone. It is quite large and proximal, most likely at the UPJ. With her having leukocytosis and mild pyuria, I think it is prudent at this point to place a stent urgently, with antibiotic management, and eventual stone treatment once a possible infection has cleared. I discussed cystoscopy and stent placement with the patient and her family, who accompany her in the emergency room his morning. We will get that performed this morning.

## 2014-05-01 ENCOUNTER — Encounter (HOSPITAL_COMMUNITY): Payer: Self-pay | Admitting: Urology

## 2014-05-01 MED ORDER — SULFAMETHOXAZOLE-TMP DS 800-160 MG PO TABS: 1.0000 | ORAL_TABLET | Freq: Two times a day (BID) | ORAL | Status: DC

## 2014-05-01 MED ORDER — OXYBUTYNIN CHLORIDE 5 MG PO TABS: 5.0000 mg | ORAL_TABLET | Freq: Three times a day (TID) | ORAL | Status: DC | PRN

## 2014-05-01 NOTE — Discharge Summary (Signed)
Patient ID: Marissa CourserCindy O Machi MRN: [id number] DOB/AGE: [birth year] 53 y.o.  Admit date: 04/30/2014 Discharge date: 05/01/2014  Primary Care Physician:  Maryelizabeth RowanEWEY,ELIZABETH, MD  Discharge Diagnoses:   Present on Admission:  . Left ureteral calculus Left hydronephrosis Urinary tract infection  Consults:  None     Discharge Medications:   Medication List    STOP taking these medications       tamsulosin 0.4 MG Caps capsule  Commonly known as:  FLOMAX      TAKE these medications       amitriptyline 50 MG tablet  Commonly known as:  ELAVIL  Take 50 mg by mouth at bedtime.     amphetamine-dextroamphetamine 20 MG 24 hr capsule  Commonly known as:  ADDERALL XR  Take 20 mg by mouth daily.     BENEFIBER PO  Take 1 capsule by mouth daily.     BIOTIN PO  Take 1 capsule by mouth daily.     ciprofloxacin 500 MG tablet  Commonly known as:  CIPRO  Take 500 mg by mouth 2 (two) times daily.     cyclobenzaprine 10 MG tablet  Commonly known as:  FLEXERIL  Take 10 mg by mouth 3 (three) times daily as needed for muscle spasms.     diclofenac 50 MG EC tablet  Commonly known as:  VOLTAREN  Take 50 mg by mouth daily.     ESTROVEN PO  Take 1 tablet by mouth daily.     fexofenadine 60 MG tablet  Commonly known as:  ALLEGRA  Take 60 mg by mouth daily.     fish oil-omega-3 fatty acids 1000 MG capsule  Take 1 g by mouth daily.     FLAXSEED OIL PO  Take 1 capsule by mouth daily.     lovastatin 20 MG tablet  Commonly known as:  MEVACOR  Take 20 mg by mouth every other day.     multivitamin capsule  Take 1 capsule by mouth daily.     omeprazole 20 MG capsule  Commonly known as:  PRILOSEC  Take 20 mg by mouth every other day.     ondansetron 4 MG tablet  Commonly known as:  ZOFRAN  Take 4 mg by mouth every 8 (eight) hours as needed for nausea or vomiting.     oxybutynin 5 MG tablet  Commonly known as:  DITROPAN  Take 1 tablet (5 mg total) by mouth every 8 (eight) hours as  needed for bladder spasms.     oxyCODONE-acetaminophen 5-325 MG per tablet  Commonly known as:  PERCOCET/ROXICET  Take 1 tablet by mouth every 4 (four) hours as needed for severe pain.     PRESCRIPTION MEDICATION  Inject 1 vial as directed every 8 (eight) weeks. Depo-Medrol, Lidocaine, & Marcaine Injection     sulfamethoxazole-trimethoprim 800-160 MG per tablet  Commonly known as:  BACTRIM DS  Take 1 tablet by mouth 2 (two) times daily.         Significant Diagnostic Studies:  Dg Chest 2 View  04/30/2014   CLINICAL DATA:  Difficulty breathing for several hours  EXAM: CHEST  2 VIEW  COMPARISON:  July 09, 2010  FINDINGS: There is a small area of infiltrate in the left base. There is stable scarring elsewhere in both lung bases. Lungs otherwise are clear. Heart size and pulmonary vascularity are normal. No adenopathy. There is thoracic dextroscoliosis with fixation rods in place. No acute fractures are identified. No pneumothorax.  IMPRESSION: Focal infiltrate left base.  Stable scarring is also noted in both lung bases. Lungs elsewhere clear. No change in cardiac silhouette. Scoliosis with rod fixation present.   Electronically Signed   By: Bretta BangWilliam  Woodruff M.D.   On: 04/30/2014 07:26   Dg Abd 1 View  04/30/2014   CLINICAL DATA:  Acute onset of left-sided flank pain.  EXAM: ABDOMEN - 1 VIEW  COMPARISON:  Renal ultrasound performed earlier today at 2:53 a.m.  FINDINGS: There appears to be a 1.0 cm stone overlying the proximal left ureter, likely about the left ureteropelvic junction. The presence of a left-sided ureteral jet on recent ultrasound may reflect intermittent obstruction due to the position of the stone.  The visualized bowel gas pattern is grossly unremarkable. No free intra-abdominal air is identified, though evaluation for free air is limited on a single supine view. A moderate amount of stool is noted along the ascending colon. Postoperative change is noted at the right upper  quadrant, reflecting prior cholecystectomy. Thoracolumbar spinal fusion hardware is partially imaged. No acute osseous abnormalities are seen.  IMPRESSION: 1.0 cm stone noted overlying the proximal left ureter, likely about the left ureteropelvic junction. The presence of a left-sided ureteral jet on recent ultrasound may reflect intermittent obstruction due to the position of the stone.   Electronically Signed   By: Roanna RaiderJeffery  Chang M.D.   On: 04/30/2014 05:53   [place] Renal  04/30/2014   CLINICAL DATA:  Acute onset of left flank pain.  Initial encounter.  EXAM: RENAL/URINARY TRACT ULTRASOUND COMPLETE  COMPARISON:  Abdominal ultrasound performed 07/16/2009  FINDINGS: Right Kidney:  Length: 10.1 cm. Echogenicity within normal limits. No mass or hydronephrosis visualized.  Left Kidney:  Length: 10.8 cm. Echogenicity within normal limits. Mild to moderate left-sided hydronephrosis is noted.  Bladder:  Appears normal for degree of bladder distention. Bilateral ureteral jets are visualized.  IMPRESSION: Mild to moderate left-sided hydronephrosis noted, raising concern for distal obstruction, though bilateral ureteral jets are seen. This may reflect an intermittently obstructing stone.   Electronically Signed   By: Roanna RaiderJeffery  Chang M.D.   On: 04/30/2014 03:44    Brief H and P: For complete details please refer to admission H and P, but in brief the patient was admitted for urgent stent placement, as she had an obstructing left proximal ureteral stone as well as pyuria and probable UTI.  Hospital Course: The patient was admitted to the floor after urgent cystoscopy and stent placement. She remained afebrile. She was pain-free following stent placement. As she was tolerating a regular diet, voiding adequately, and without pain, she was discharged on postoperative day #1. Active Problems:   Kidney stone   Left ureteral calculus   Day of Discharge BP 135/75  Pulse 88  Temp(Src) 98.9 F (37.2 C) (Oral)  Resp 18   Ht 5\' 3"  (1.6 m)  Wt 70.308 kg (155 lb)  BMI 27.46 kg/m2  SpO2 99%  No results found for this or any previous visit (from the past 24 hour(s)).  Physical Exam: General: Alert and awake oriented x3 not in any acute distress. HEENT: anicteric sclera, pupils reactive to light and accommodation CVS: S1-S2 clear no murmur rubs or gallops Chest: clear to auscultation bilaterally, no wheezing rales or rhonchi Abdomen: soft nontender, nondistended, normal bowel sounds, no organomegaly Extremities: no cyanosis, clubbing or edema noted bilaterally Neuro: Cranial nerves II-XII intact, no focal neurological deficits  Disposition:  Home  Diet:  No restrictions   Activity:  Gradually increase   Disposition and Follow-up:  Discharge Instructions   Discharge patient    Complete by:  As directed             I discussed eventual lithotripsy versus ureteroscopy with the patient. She will call for the CD disc to be sent to my office.  TESTS THAT NEED FOLLOW-UP  Urine culture  DISCHARGE FOLLOW-UP Follow-up Information   Follow up with Chelsea Aus, MD. (We will call you)    Specialty:  Urology   Contact information:   [address] [place] [address] [phone]       Time spent on Discharge:  20 minutes  Signed: Chelsea Aus 05/01/2014, 9:23 AM

## 2014-05-01 NOTE — Progress Notes (Signed)
Patient discharged home with husband, discharge instructions given and explained to patient and she verbalized understanding, denies any pain/distress, accompanied by by husband. No wound noted, skin intact.

## 2014-05-12 ENCOUNTER — Other Ambulatory Visit: Payer: Self-pay | Admitting: Urology

## 2014-05-15 ENCOUNTER — Encounter (HOSPITAL_BASED_OUTPATIENT_CLINIC_OR_DEPARTMENT_OTHER): Payer: Self-pay | Admitting: *Deleted

## 2014-05-17 ENCOUNTER — Encounter (HOSPITAL_BASED_OUTPATIENT_CLINIC_OR_DEPARTMENT_OTHER): Payer: Self-pay | Admitting: *Deleted

## 2014-05-17 NOTE — Progress Notes (Signed)
NPO AFTER MN. ARRIVE AT 1015. NEEDS HG. WILL TAKE ALLEGRA, PRILOSEC AND IF NEEDED TAKE OXYCODONE AM DOS W/ SIPS OF WATER.

## 2014-05-19 ENCOUNTER — Encounter (HOSPITAL_BASED_OUTPATIENT_CLINIC_OR_DEPARTMENT_OTHER)
Admission: RE | Disposition: A | Discharge status: Home / Self Care | Payer: Self-pay | Source: Ambulatory Visit | Attending: Urology

## 2014-05-19 ENCOUNTER — Encounter (HOSPITAL_BASED_OUTPATIENT_CLINIC_OR_DEPARTMENT_OTHER): Payer: Self-pay | Admitting: *Deleted

## 2014-05-19 ENCOUNTER — Ambulatory Visit (HOSPITAL_BASED_OUTPATIENT_CLINIC_OR_DEPARTMENT_OTHER)
Admission: RE | Admit: 2014-05-19 | Discharge: 2014-05-19 | Disposition: A | Discharge status: Home / Self Care | Payer: 59 | Source: Ambulatory Visit | Attending: Urology | Admitting: Urology

## 2014-05-19 ENCOUNTER — Encounter (HOSPITAL_BASED_OUTPATIENT_CLINIC_OR_DEPARTMENT_OTHER): Payer: 59 | Admitting: Anesthesiology

## 2014-05-19 ENCOUNTER — Ambulatory Visit (HOSPITAL_BASED_OUTPATIENT_CLINIC_OR_DEPARTMENT_OTHER): Payer: 59 | Admitting: Anesthesiology

## 2014-05-19 DIAGNOSIS — F329 Major depressive disorder, single episode, unspecified: Secondary | ICD-10-CM | POA: Insufficient documentation

## 2014-05-19 DIAGNOSIS — N201 Calculus of ureter: Secondary | ICD-10-CM | POA: Diagnosis present

## 2014-05-19 DIAGNOSIS — M4180 Other forms of scoliosis, site unspecified: Secondary | ICD-10-CM | POA: Insufficient documentation

## 2014-05-19 DIAGNOSIS — K219 Gastro-esophageal reflux disease without esophagitis: Secondary | ICD-10-CM | POA: Insufficient documentation

## 2014-05-19 DIAGNOSIS — F419 Anxiety disorder, unspecified: Secondary | ICD-10-CM | POA: Diagnosis not present

## 2014-05-19 DIAGNOSIS — E785 Hyperlipidemia, unspecified: Secondary | ICD-10-CM | POA: Insufficient documentation

## 2014-05-19 DIAGNOSIS — Z880 Allergy status to penicillin: Secondary | ICD-10-CM | POA: Insufficient documentation

## 2014-05-19 DIAGNOSIS — Z87891 Personal history of nicotine dependence: Secondary | ICD-10-CM | POA: Insufficient documentation

## 2014-05-19 HISTORY — DX: Attention-deficit hyperactivity disorder, unspecified type: F90.9

## 2014-05-19 HISTORY — DX: Hyperlipidemia, unspecified: E78.5

## 2014-05-19 HISTORY — DX: Scoliosis, unspecified: M41.9

## 2014-05-19 HISTORY — PX: CYSTOSCOPY WITH RETROGRADE PYELOGRAM, URETEROSCOPY AND STENT PLACEMENT: SHX5789

## 2014-05-19 HISTORY — PX: HOLMIUM LASER APPLICATION: SHX5852

## 2014-05-19 HISTORY — DX: Unspecified temporomandibular joint disorder, unspecified side: M26.609

## 2014-05-19 HISTORY — DX: Other constipation: K59.09

## 2014-05-19 HISTORY — DX: Presence of spectacles and contact lenses: Z97.3

## 2014-05-19 HISTORY — DX: Calculus of ureter: N20.1

## 2014-05-19 HISTORY — PX: CYSTOSCOPY W/ URETERAL STENT PLACEMENT: SHX1429

## 2014-05-19 LAB — POCT HEMOGLOBIN-HEMACUE: Hemoglobin: 14.5 g/dL (ref 12.0–15.0)

## 2014-05-19 SURGERY — CYSTOURETEROSCOPY, WITH RETROGRADE PYELOGRAM AND STENT INSERTION
Anesthesia: General | Site: Ureter | Laterality: Left

## 2014-05-19 MED ORDER — IOHEXOL 350 MG/ML SOLN
INTRAVENOUS | Status: DC | PRN
Start: 2014-05-19 — End: 2014-05-19
  Administered 2014-05-19: 12 mL

## 2014-05-19 MED ORDER — MIDAZOLAM HCL 5 MG/5ML IJ SOLN
INTRAMUSCULAR | Status: DC | PRN
  Administered 2014-05-19: 2 mg via INTRAVENOUS

## 2014-05-19 MED ORDER — FENTANYL CITRATE 0.05 MG/ML IJ SOLN
INTRAMUSCULAR | Status: DC | PRN
  Administered 2014-05-19: 25 ug via INTRAVENOUS
  Administered 2014-05-19: 50 ug via INTRAVENOUS
  Administered 2014-05-19: 25 ug via INTRAVENOUS
  Administered 2014-05-19: 50 ug via INTRAVENOUS

## 2014-05-19 MED ORDER — DEXAMETHASONE SODIUM PHOSPHATE 4 MG/ML IJ SOLN
INTRAMUSCULAR | Status: DC | PRN
  Administered 2014-05-19: 10 mg via INTRAVENOUS

## 2014-05-19 MED ORDER — METOCLOPRAMIDE HCL 5 MG/ML IJ SOLN
INTRAMUSCULAR | Status: DC | PRN
  Administered 2014-05-19: 10 mg via INTRAVENOUS

## 2014-05-19 MED ORDER — CIPROFLOXACIN IN D5W 400 MG/200ML IV SOLN
400.0000 mg | INTRAVENOUS | Status: AC
  Administered 2014-05-19: 400 mg via INTRAVENOUS
  Filled 2014-05-19: qty 200

## 2014-05-19 MED ORDER — ACETAMINOPHEN 10 MG/ML IV SOLN
INTRAVENOUS | Status: DC | PRN
  Administered 2014-05-19: 1000 mg via INTRAVENOUS

## 2014-05-19 MED ORDER — FENTANYL CITRATE 0.05 MG/ML IJ SOLN
25.0000 ug | INTRAMUSCULAR | Status: DC | PRN
  Filled 2014-05-19: qty 1

## 2014-05-19 MED ORDER — STERILE WATER FOR IRRIGATION IR SOLN
Status: DC | PRN
  Administered 2014-05-19: 500 mL

## 2014-05-19 MED ORDER — LIDOCAINE HCL (CARDIAC) 20 MG/ML IV SOLN
INTRAVENOUS | Status: DC | PRN
  Administered 2014-05-19: 70 mg via INTRAVENOUS
  Administered 2014-05-19: 30 mg via INTRAVENOUS

## 2014-05-19 MED ORDER — CIPROFLOXACIN HCL 250 MG PO TABS: 250.0000 mg | ORAL_TABLET | Freq: Two times a day (BID) | ORAL | Status: DC

## 2014-05-19 MED ORDER — MIDAZOLAM HCL 2 MG/2ML IJ SOLN
INTRAMUSCULAR | Status: AC
  Filled 2014-05-19: qty 2

## 2014-05-19 MED ORDER — LACTATED RINGERS IV SOLN
INTRAVENOUS | Status: DC
Start: 2014-05-19 — End: 2014-05-19
  Administered 2014-05-19: 11:00:00 via INTRAVENOUS
  Filled 2014-05-19: qty 1000

## 2014-05-19 MED ORDER — LACTATED RINGERS IV SOLN
INTRAVENOUS | Status: DC
  Administered 2014-05-19: 11:00:00 via INTRAVENOUS
  Filled 2014-05-19: qty 1000

## 2014-05-19 MED ORDER — PROPOFOL 10 MG/ML IV BOLUS
INTRAVENOUS | Status: DC | PRN
  Administered 2014-05-19: 200 mg via INTRAVENOUS

## 2014-05-19 MED ORDER — LACTATED RINGERS IV SOLN
INTRAVENOUS | Status: DC
  Filled 2014-05-19: qty 1000

## 2014-05-19 MED ORDER — SODIUM CHLORIDE 0.9 % IR SOLN
Status: DC | PRN
  Administered 2014-05-19: 4000 mL

## 2014-05-19 MED ORDER — FENTANYL CITRATE 0.05 MG/ML IJ SOLN
INTRAMUSCULAR | Status: AC
  Filled 2014-05-19: qty 4

## 2014-05-19 MED ORDER — CIPROFLOXACIN IN D5W 400 MG/200ML IV SOLN
INTRAVENOUS | Status: AC
  Filled 2014-05-19: qty 200

## 2014-05-19 SURGICAL SUPPLY — 27 items
ADAPTER CATH URET PLST 4-6FR (CATHETERS) IMPLANT
BAG DRAIN URO-CYSTO SKYTR STRL (DRAIN) ×2 IMPLANT
BASKET ZERO TIP NITINOL 2.4FR (BASKET) ×2 IMPLANT
CANISTER SUCT LVC 12 LTR MEDI- (MISCELLANEOUS) ×2 IMPLANT
CATH INTERMIT  6FR 70CM (CATHETERS) ×2 IMPLANT
CLOTH BEACON ORANGE TIMEOUT ST (SAFETY) ×2 IMPLANT
DRAPE CAMERA CLOSED 9X96 (DRAPES) ×2 IMPLANT
FIBER LASER TRAC TIP (UROLOGICAL SUPPLIES) ×2 IMPLANT
GLOVE BIO SURGEON STRL SZ 6.5 (GLOVE) ×2 IMPLANT
GLOVE BIO SURGEON STRL SZ8 (GLOVE) ×2 IMPLANT
GLOVE INDICATOR 7.0 STRL GRN (GLOVE) ×4 IMPLANT
GOWN PREVENTION PLUS LG XLONG (DISPOSABLE) ×2 IMPLANT
GOWN STRL REIN XL XLG (GOWN DISPOSABLE) IMPLANT
GOWN STRL REUS W/ TWL LRG LVL3 (GOWN DISPOSABLE) ×1 IMPLANT
GOWN STRL REUS W/TWL LRG LVL3 (GOWN DISPOSABLE) ×1
GOWN STRL REUS W/TWL XL LVL3 (GOWN DISPOSABLE) ×2 IMPLANT
GUIDEWIRE 0.038 PTFE COATED (WIRE) IMPLANT
GUIDEWIRE ANG ZIPWIRE 038X150 (WIRE) IMPLANT
GUIDEWIRE STR DUAL SENSOR (WIRE) ×2 IMPLANT
IV NS 1000ML (IV SOLUTION) ×1
IV NS 1000ML BAXH (IV SOLUTION) ×1 IMPLANT
IV NS IRRIG 3000ML ARTHROMATIC (IV SOLUTION) ×2 IMPLANT
NS IRRIG 500ML POUR BTL (IV SOLUTION) IMPLANT
PACK CYSTO (CUSTOM PROCEDURE TRAY) ×2 IMPLANT
SHEATH ACCESS URETERAL 38CM (SHEATH) ×4 IMPLANT
STENT URET 6FRX24 CONTOUR (STENTS) ×2 IMPLANT
WATER STERILE IRR 500ML POUR (IV SOLUTION) ×2 IMPLANT

## 2014-05-19 NOTE — Transfer of Care (Signed)
Immediate Anesthesia Transfer of Care Note  Patient: Marissa Maxwell  Procedure(s) Performed: Procedure(s) (LRB): CYSTOSCOPY WITH LEFT RETROGRADE PYELOGRAM, URETEROSCOPY AND STENT EXTRACTION (Left) HOLMIUM LASER APPLICATION (Left) CYSTOSCOPY WITH STENT REPLACEMENT (Left)  Patient Location: PACU  Anesthesia Type: General  Level of Consciousness: awake, alert  and oriented  Airway & Oxygen Therapy: Patient Spontanous Breathing and Patient connected to face mask oxygen  Post-op Assessment: Report given to PACU RN and Post -op Vital signs reviewed and stable  Post vital signs: Reviewed and stable  Complications: No apparent anesthesia complications

## 2014-05-19 NOTE — Discharge Instructions (Signed)
[facility] [phone] Post Ureteroscopy With or Without Stent Instructions  Definitions:  Ureter: The duct that transports urine from the kidney to the bladder. Stent:   A plastic hollow tube that is placed into the ureter, from the kidney to the   bladder to prevent the ureter from swelling shut.  GENERAL INSTRUCTIONS:  Despite the fact that no skin incisions were used, the area around the ureter and bladder is raw and irritated. The stent is a foreign body which will further irritate the bladder wall. This irritation is manifested by increased frequency of urination, both day and night, and by an increase in the urge to urinate. In some, the urge to urinate is present almost always. Sometimes the urge is strong enough that you may not be able to stop yourself from urinating. The only real cure is to remove the stent and then give time for the bladder wall to heal which can't be done until the danger of the ureter swelling shut has passed, which varies.  You may see some blood in your urine while the stent is in place and a few days afterwards. Do not be alarmed, even if the urine was clear for a while. Get off your feet and drink lots of fluids until clearing occurs. If you start to pass clots or don't improve, call us.  Pull string to remove stent on Monday morning.  DIET: You may return to your normal diet immediately. Because of the raw surface of your bladder, alcohol, spicy foods, acid type foods and drinks with caffeine may cause irritation or frequency and should be used in moderation. To keep your urine flowing freely and to avoid constipation, drink plenty of fluids during the day ( 8-10 glasses ). Tip: Avoid cranberry juice because it is very acidic.  ACTIVITY: Your physical activity doesn't need to be restricted. However, if you are very active, you may see some blood in your urine. We suggest that you reduce your activity under these circumstances until the  bleeding has stopped.  BOWELS: It is important to keep your bowels regular during the postoperative period. Straining with bowel movements can cause bleeding. A bowel movement every other day is reasonable. Use a mild laxative if needed, such as Milk of Magnesia 2-3 tablespoons, or 2 Dulcolax tablets. Call if you continue to have problems. If you have been taking narcotics for pain, before, during or after your surgery, you may be constipated. Take a laxative if necessary.   MEDICATION: You should resume your pre-surgery medications unless told not to. In addition you will often be given an antibiotic to prevent infection. These should be taken as prescribed until the bottles are finished unless you are having an unusual reaction to one of the drugs.  PROBLEMS YOU SHOULD REPORT TO US:  Fevers over 100.5 Fahrenheit.  Heavy bleeding, or clots ( See above notes about blood in urine ).  Inability to urinate.  Drug reactions ( hives, rash, nausea, vomiting, diarrhea ).  Severe burning or pain with urination that is not improving.  FOLLOW-UP: You will need a follow-up appointment to monitor your progress. Call for this appointment at the number listed above. Usually the first appointment will be about three to fourteen days after your surgery.      Post Anesthesia Home Care Instructions  Activity: Get plenty of rest for the remainder of the day. A responsible adult should stay with you for 24 hours following the procedure.  For the next 24 hours,  DO NOT: -Drive a car -Advertising copywriterperate machinery -Drink alcoholic beverages -Take any medication unless instructed by your physician -Make any legal decisions or sign important papers.  Meals: Start with liquid foods such as gelatin or soup. Progress to regular foods as tolerated. Avoid greasy, spicy, heavy foods. If nausea and/or vomiting occur, drink only clear liquids until the nausea and/or vomiting subsides. Call your physician if vomiting  continues.  Special Instructions/Symptoms: Your throat may feel dry or sore from the anesthesia or the breathing tube placed in your throat during surgery. If this causes discomfort, gargle with warm salt water. The discomfort should disappear within 24 hours.

## 2014-05-19 NOTE — Anesthesia Procedure Notes (Signed)
Procedure Name: LMA Insertion Date/Time: 05/19/2014 11:48 AM Performed by: Norva PavlovALLAWAY, Shariff Lasky G Pre-anesthesia Checklist: Patient identified, Emergency Drugs available, Suction available and Patient being monitored Patient Re-evaluated:Patient Re-evaluated prior to inductionOxygen Delivery Method: Circle System Utilized Preoxygenation: Pre-oxygenation with 100% oxygen Intubation Type: IV induction Ventilation: Mask ventilation without difficulty LMA: LMA inserted LMA Size: 4.0 Number of attempts: 1 Airway Equipment and Method: bite block Placement Confirmation: positive ETCO2 Tube secured with: Tape Dental Injury: Teeth and Oropharynx as per pre-operative assessment

## 2014-05-19 NOTE — Anesthesia Preprocedure Evaluation (Signed)
Anesthesia Evaluation  Patient identified by MRN, date of birth, ID band Patient awake    Reviewed: Allergy & Precautions, H&P , NPO status , Patient's Chart, lab work & pertinent test results  Airway Mallampati: II  TM Distance: >3 FB Neck ROM: full    Dental no notable dental hx. (+) Teeth Intact, Dental Advisory Given   Pulmonary neg pulmonary ROS, former smoker,  TMJ problems breath sounds clear to auscultation  Pulmonary exam normal       Cardiovascular Exercise Tolerance: Good negative cardio ROS  Rhythm:regular Rate:Normal     Neuro/Psych Anxiety Depression negative neurological ROS  negative psych ROS   GI/Hepatic negative GI ROS, Neg liver ROS, GERD-  Medicated and Controlled,  Endo/Other  negative endocrine ROS  Renal/GU negative Renal ROS  negative genitourinary   Musculoskeletal   Abdominal   Peds  Hematology negative hematology ROS (+)   Anesthesia Other Findings   Reproductive/Obstetrics negative OB ROS                             Anesthesia Physical Anesthesia Plan  ASA: II  Anesthesia Plan: General   Post-op Pain Management:    Induction: Intravenous  Airway Management Planned: LMA  Additional Equipment:   Intra-op Plan:   Post-operative Plan:   Informed Consent: I have reviewed the patients History and Physical, chart, labs and discussed the procedure including the risks, benefits and alternatives for the proposed anesthesia with the patient or authorized representative who has indicated his/her understanding and acceptance.   Dental Advisory Given  Plan Discussed with: CRNA and Surgeon  Anesthesia Plan Comments:         Anesthesia Quick Evaluation

## 2014-05-19 NOTE — H&P (Signed)
  H&P  Chief Complaint: Left sided kidney stone  History of Present Illness: Marissa Maxwell is a 53 y.o. year old female who presented 3 weeks ago with a left ureteral stone as well as probable UTI. She was stented for the stone/infection and presents now for stone management.  Past Medical History  Diagnosis Date  . GERD (gastroesophageal reflux disease)   . Anxiety   . Depression   . Left ureteral calculus   . Multilevel scoliosis   . Wears contact lenses   . TMJ (temporomandibular joint syndrome)     OCCASIONAL USE OF BITE GUARD  . ADHD (attention deficit hyperactivity disorder)   . Hyperlipidemia   . Chronic constipation     Past Surgical History  Procedure Laterality Date  . Carpal tunnel release  07/03/2011    Procedure: CARPAL TUNNEL RELEASE;  Surgeon: Wyn Forsterobert V Sypher Jr., MD;  Location: [facility];  Service: Orthopedics;  Laterality: Right;  . Cystoscopy w/ ureteral stent placement Left 04/30/2014    Procedure: CYSTOSCOPY WITH LEFT URETERAL STENT PLACEMENT;  Surgeon: Chelsea AusStephen M Alanzo Lamb, MD;  Location: [facility];  Service: Urology;  Laterality: Left;  . Vaginal hysterectomy  05-12-2000  . Tonsillectomy  as child  . Cesarean section  1987  . Back surgery  1975    scoliosis correction with herrington rods  . Dilatation & currettage/hysteroscopy with resectocope  04-30-2000    submucous fibroid  . Laparoscopic cholecystectomy  07-01-2010    Home Medications:  No prescriptions prior to admission    Allergies:  Allergies  Allergen Reactions  . Penicillins Rash    History reviewed. No pertinent family history.  Social History:  reports that she quit smoking about 40 years ago. Her smoking use included Cigarettes. She smoked 0.00 packs per day for 4 years. She has never used smokeless tobacco. She reports that she does not drink alcohol or use illicit drugs.  ROS: A complete review of systems was performed.  All systems are negative except for  pertinent findings as noted.  Physical Exam:  Vital signs in last 24 hours:   General:  Alert and oriented, No acute distress HEENT: Normocephalic, atraumatic Neck: No JVD or lymphadenopathy Cardiovascular: Regular rate and rhythm Lungs: Clear bilaterally Abdomen: Soft, nontender, nondistended, no abdominal masses Back: No CVA tenderness Extremities: No edema Neurologic: Grossly intact  Laboratory Data:  No results found for this or any previous visit (from the past 24 hour(s)). No results found for this or any previous visit (from the past 240 hour(s)). Creatinine: No results found for this basename: CREATININE,  in the last 168 hours  Radiologic Imaging: No results found.  Impression/Assessment:  7x11 mm proximal left ureteral stone, s/p stenting  Plan:  Cystoscopy, left J2 stent removal, left RGP, left ureteroscopy, laser application, stone extraction, possible stent placement  Chelsea AusDAHLSTEDT, Syair Fricker M 05/19/2014, 6:39 AM  Bertram MillardStephen M. Dung Salinger MD

## 2014-05-19 NOTE — Anesthesia Postprocedure Evaluation (Signed)
  Anesthesia Post-op Note  Patient: Janyth ContesCindy O Banfill  Procedure(s) Performed: Procedure(s) (LRB): CYSTOSCOPY WITH LEFT RETROGRADE PYELOGRAM, URETEROSCOPY AND STENT EXTRACTION (Left) HOLMIUM LASER APPLICATION (Left) CYSTOSCOPY WITH STENT REPLACEMENT (Left)  Patient Location: PACU  Anesthesia Type: General  Level of Consciousness: awake and alert   Airway and Oxygen Therapy: Patient Spontanous Breathing  Post-op Pain: mild  Post-op Assessment: Post-op Vital signs reviewed, Patient's Cardiovascular Status Stable, Respiratory Function Stable, Patent Airway and No signs of Nausea or vomiting  Last Vitals:  Filed Vitals:   05/19/14 1315  BP: 117/68  Pulse: 82  Temp:   Resp: 11    Post-op Vital Signs: stable   Complications: No apparent anesthesia complications

## 2014-05-22 ENCOUNTER — Encounter (HOSPITAL_BASED_OUTPATIENT_CLINIC_OR_DEPARTMENT_OTHER): Payer: Self-pay | Admitting: Urology

## 2014-05-24 NOTE — Addendum Note (Signed)
Addendum  created 05/24/14 1125 by Gaetano Hawthorneharles L Ikram Riebe, MD   Modules edited: Anesthesia Responsible Staff

## 2014-05-24 NOTE — Op Note (Signed)
PATIENT:  Marissa Maxwell  PRE-OPERATIVE DIAGNOSIS: 7 x 11 mm left proximal ureteral stone  POST-OPERATIVE DIAGNOSIS: 7 x 11 mm left lower pole stone  PROCEDURE:  Cystoscopy, left retrograde pyelogram withinterpretive fluoroscopy, flexible left ureteroscopy, holmium laser and extraction of left renal stone, placement of 24 x 6 French contour stent with string  SURGEON:  Bertram MillardStephen M. Willett Lefeber, M.D.  ANESTHESIA:  General  EBL:  Minimal  DRAINS: previously mentioned stent  LOCAL MEDICATIONS USED:  None  SPECIMEN:  Stone fragments,2 the patient's family  INDICATION: Marissa CourserCindy O Triplett is a   Description of procedure: The patient was properly identified and marked (if applicable) in the holding area. They were then  taken to the operating room and placed on the table in a supine position. General anesthesia was then administered. Once fully anesthetized the patient was moved to the dorsolithotomy position and the genitalia and perineum were sterilely prepped and draped in standard fashion. An official timeout was then performed.   A 22 French panendoscope was advanced into her bladder. The bladder was inspected and found to be normal. Ureteral orifices were normal. The left ureteral stent was identified and extracted. The left ureteral orifice was cannulated with a 6 [place]French open-ended catheter. Retrograde ureteropyelogram was performed.  This revealed a slightly widened ureter throughout. There was no ureteral filling defect consistent with a stone. The pyelocalyceal system was normal. There was a filling defect in the lower pole calyx on the left.  At this time, I navigated a sensor-tip guidewire through the open-ended catheter. This was advanced up to the renal pelvis where a curl was seen. The open-ended catheter and cystoscope were removed. I then passed a 12/14 ureteral access sheath easily up to the proximal ureter using fluoroscopic guidance, over the guidewire. The core was removed. I then  placed/passed the 6 French flexible ureteroscope up the access sheath. The calyceal system and pelvis were inspected. The only abnormality seen was a stone in the lower pole calyx. This was quite large. It was grasped and brought into the mid/posterior pole calyx. At this point, the Nitinol basket was removed, and using the 200  fiber, holmium laser energy was applied to the stone, breaking it up into multiple smaller fragments. During the treatment, these fragments scattered over several calyces. All stone fragments were small enough that I felt could be extracted. The laser was turned off, the laser fiber removed, and a 9 all basket used to extract all the fragments. The calyces were carefully inspected after extraction, no significant stone burden was seen. At this point, I removed the ureteroscope and the ureteral access sheath, and then replaced the guidewire and passed a 24 cm x 6 French contour stent over a guidewire, with excellent proximal and distal curl seen on fluoroscopic and cystoscopic guidance, respectively. The bladder was then drained. The string was left on the stent and brought out through the urethra, taped to the patient's thigh.  The patient tolerated the procedure well. She was awakened and taken to the PACU in stable condition.   PLAN OF CARE: Discharge to home after PACU  PATIENT DISPOSITION:  PACU - hemodynamically stable.

## 2014-06-28 ENCOUNTER — Other Ambulatory Visit (HOSPITAL_COMMUNITY): Payer: Self-pay | Admitting: Family Medicine

## 2014-06-28 DIAGNOSIS — Z1231 Encounter for screening mammogram for malignant neoplasm of breast: Secondary | ICD-10-CM

## 2014-07-04 ENCOUNTER — Ambulatory Visit (HOSPITAL_COMMUNITY)
Admission: RE | Admit: 2014-07-04 | Discharge: 2014-07-04 | Disposition: A | Discharge status: Home / Self Care | Payer: 59 | Source: Ambulatory Visit | Attending: Family Medicine | Admitting: Family Medicine

## 2014-07-04 DIAGNOSIS — Z1231 Encounter for screening mammogram for malignant neoplasm of breast: Secondary | ICD-10-CM | POA: Diagnosis present

## 2014-08-25 DIAGNOSIS — G894 Chronic pain syndrome: Secondary | ICD-10-CM | POA: Insufficient documentation

## 2015-05-28 ENCOUNTER — Other Ambulatory Visit: Payer: Self-pay

## 2015-05-28 DIAGNOSIS — Z1231 Encounter for screening mammogram for malignant neoplasm of breast: Secondary | ICD-10-CM

## 2015-07-06 ENCOUNTER — Ambulatory Visit
Admission: RE | Admit: 2015-07-06 | Discharge: 2015-07-06 | Disposition: A | Discharge status: Home / Self Care | Payer: 59 | Source: Ambulatory Visit

## 2015-07-06 DIAGNOSIS — Z1231 Encounter for screening mammogram for malignant neoplasm of breast: Secondary | ICD-10-CM

## 2015-10-12 ENCOUNTER — Other Ambulatory Visit: Payer: Self-pay | Admitting: Specialist

## 2015-10-12 DIAGNOSIS — M5412 Radiculopathy, cervical region: Secondary | ICD-10-CM

## 2015-11-18 IMAGING — CR DG CHEST 2V
2 series · 2 of 2 positions shown · non-contrast
Comparison: July 09, 2010

CLINICAL DATA: Difficulty breathing for several hours

EXAM:
CHEST  2 VIEW

[w chest pa]
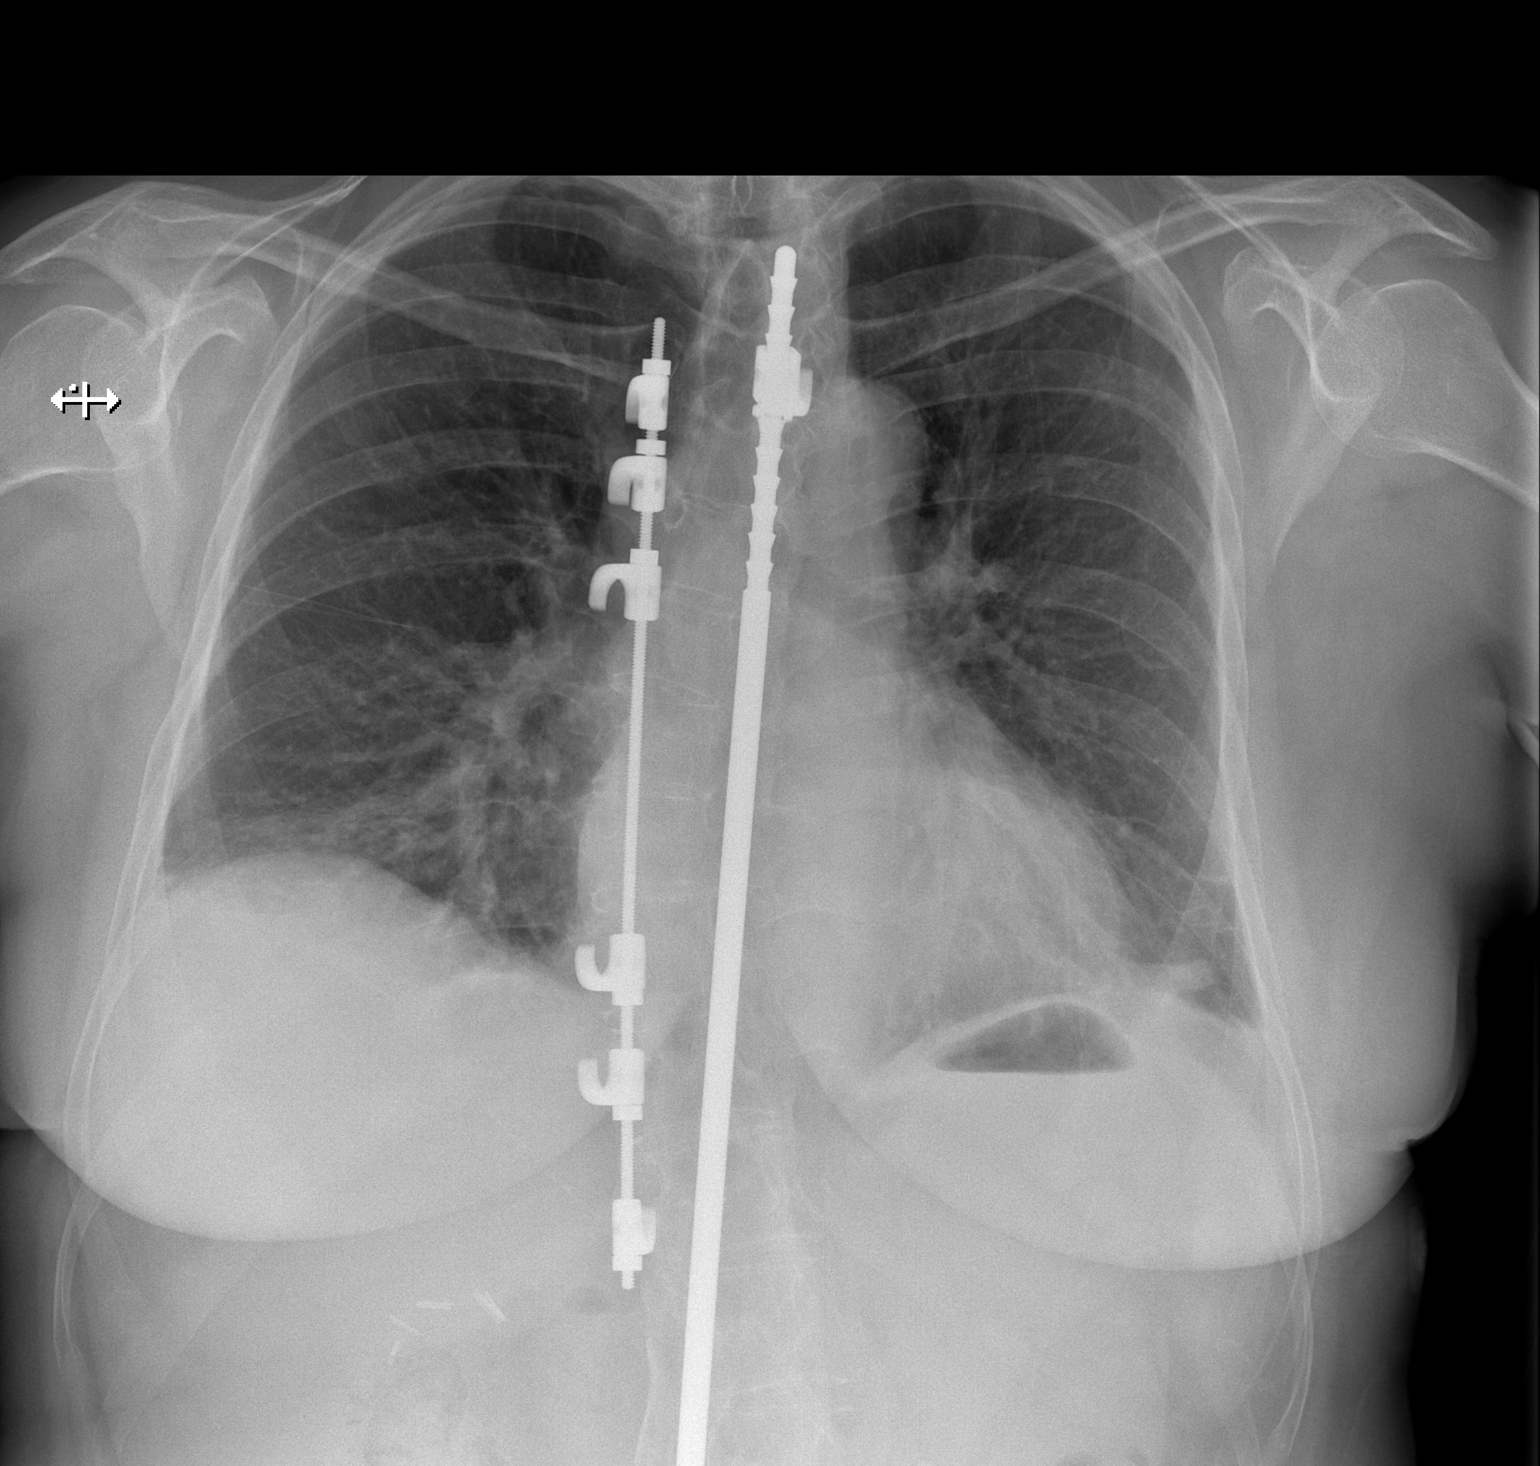

[w chest lat]
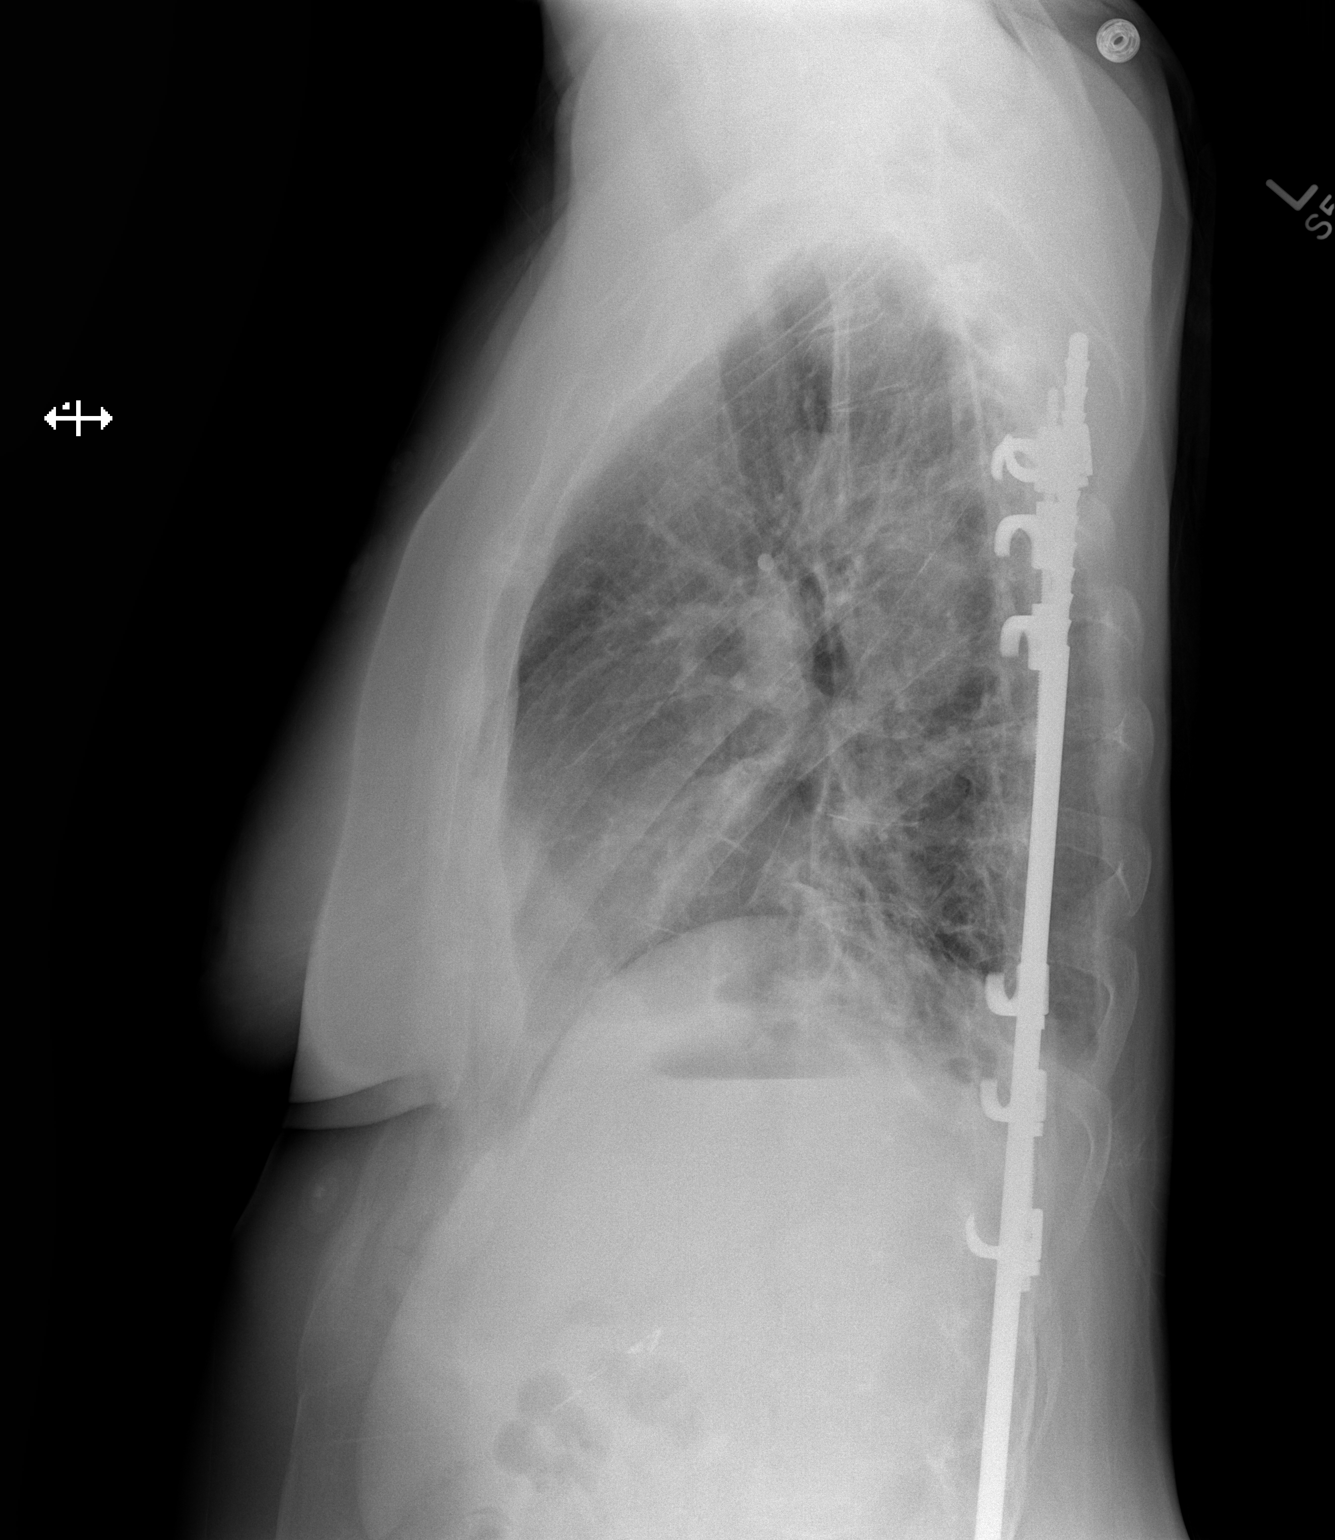

[2 of 2 positions shown; findings below may reference images not displayed]

FINDINGS: There is a small area of infiltrate in the left base. There is
stable scarring elsewhere in both lung bases. Lungs otherwise are
clear. Heart size and pulmonary vascularity are normal. No
adenopathy. There is thoracic dextroscoliosis with fixation rods in
place. No acute fractures are identified. No pneumothorax.
IMPRESSION: Focal infiltrate left base. Stable scarring is also noted in both
lung bases. Lungs elsewhere clear. No change in cardiac silhouette.
Scoliosis with rod fixation present.

## 2015-11-19 ENCOUNTER — Other Ambulatory Visit: Payer: Self-pay | Admitting: Specialist

## 2015-11-19 DIAGNOSIS — M25511 Pain in right shoulder: Secondary | ICD-10-CM

## 2015-11-27 ENCOUNTER — Ambulatory Visit
Admission: RE | Admit: 2015-11-27 | Discharge: 2015-11-27 | Disposition: A | Discharge status: Home / Self Care | Payer: 59 | Source: Ambulatory Visit | Attending: Specialist | Admitting: Specialist

## 2015-11-27 DIAGNOSIS — M5412 Radiculopathy, cervical region: Secondary | ICD-10-CM

## 2015-11-27 DIAGNOSIS — M25511 Pain in right shoulder: Secondary | ICD-10-CM

## 2015-12-04 DIAGNOSIS — F325 Major depressive disorder, single episode, in full remission: Secondary | ICD-10-CM | POA: Insufficient documentation

## 2016-07-07 ENCOUNTER — Other Ambulatory Visit: Payer: Self-pay | Admitting: Family Medicine

## 2016-07-07 DIAGNOSIS — Z1231 Encounter for screening mammogram for malignant neoplasm of breast: Secondary | ICD-10-CM

## 2016-10-02 ENCOUNTER — Other Ambulatory Visit: Payer: Self-pay | Admitting: Family Medicine

## 2016-10-02 DIAGNOSIS — Z1231 Encounter for screening mammogram for malignant neoplasm of breast: Secondary | ICD-10-CM

## 2016-10-03 ENCOUNTER — Ambulatory Visit
Admission: RE | Admit: 2016-10-03 | Discharge: 2016-10-03 | Disposition: A | Discharge status: Home / Self Care | Payer: 59 | Source: Ambulatory Visit | Attending: Family Medicine | Admitting: Family Medicine

## 2016-10-03 DIAGNOSIS — Z1231 Encounter for screening mammogram for malignant neoplasm of breast: Secondary | ICD-10-CM

## 2017-06-04 ENCOUNTER — Other Ambulatory Visit: Payer: Self-pay | Admitting: Specialist

## 2017-06-04 DIAGNOSIS — M5417 Radiculopathy, lumbosacral region: Secondary | ICD-10-CM

## 2017-06-16 ENCOUNTER — Ambulatory Visit
Admission: RE | Admit: 2017-06-16 | Discharge: 2017-06-16 | Disposition: A | Discharge status: Home / Self Care | Payer: 59 | Source: Ambulatory Visit | Attending: Specialist | Admitting: Specialist

## 2017-06-16 DIAGNOSIS — M5417 Radiculopathy, lumbosacral region: Secondary | ICD-10-CM

## 2017-07-15 DIAGNOSIS — M47812 Spondylosis without myelopathy or radiculopathy, cervical region: Secondary | ICD-10-CM | POA: Insufficient documentation

## 2018-04-22 ENCOUNTER — Other Ambulatory Visit: Payer: Self-pay | Admitting: Specialist

## 2018-04-22 DIAGNOSIS — M5417 Radiculopathy, lumbosacral region: Secondary | ICD-10-CM

## 2018-05-10 ENCOUNTER — Ambulatory Visit
Admission: RE | Admit: 2018-05-10 | Discharge: 2018-05-10 | Disposition: A | Discharge status: Home / Self Care | Payer: 59 | Source: Ambulatory Visit | Attending: Specialist | Admitting: Specialist

## 2018-05-10 DIAGNOSIS — M5417 Radiculopathy, lumbosacral region: Secondary | ICD-10-CM

## 2018-12-21 ENCOUNTER — Other Ambulatory Visit: Payer: Self-pay | Admitting: Specialist

## 2018-12-21 DIAGNOSIS — M5417 Radiculopathy, lumbosacral region: Secondary | ICD-10-CM

## 2018-12-24 ENCOUNTER — Other Ambulatory Visit: Payer: Self-pay

## 2018-12-24 ENCOUNTER — Ambulatory Visit
Admission: RE | Admit: 2018-12-24 | Discharge: 2018-12-24 | Disposition: A | Discharge status: Home / Self Care | Payer: BC Managed Care – PPO | Source: Ambulatory Visit | Attending: Specialist | Admitting: Specialist

## 2018-12-24 DIAGNOSIS — M5417 Radiculopathy, lumbosacral region: Secondary | ICD-10-CM

## 2019-01-14 ENCOUNTER — Other Ambulatory Visit: Payer: Self-pay | Admitting: Neurosurgery

## 2019-01-14 DIAGNOSIS — M4726 Other spondylosis with radiculopathy, lumbar region: Secondary | ICD-10-CM

## 2019-02-10 ENCOUNTER — Ambulatory Visit
Admission: RE | Admit: 2019-02-10 | Discharge: 2019-02-10 | Disposition: A | Discharge status: Home / Self Care | Payer: BC Managed Care – PPO | Source: Ambulatory Visit | Attending: Neurosurgery | Admitting: Neurosurgery

## 2019-02-10 DIAGNOSIS — M4726 Other spondylosis with radiculopathy, lumbar region: Secondary | ICD-10-CM

## 2019-02-10 MED ORDER — DIAZEPAM 5 MG PO TABS
10.0000 mg | ORAL_TABLET | Freq: Once | ORAL | Status: AC
  Administered 2019-02-10: 10 mg via ORAL

## 2019-02-10 MED ORDER — IOPAMIDOL (ISOVUE-M 200) INJECTION 41%
20.0000 mL | Freq: Once | INTRAMUSCULAR | Status: AC
  Administered 2019-02-10: 20 mL via INTRATHECAL

## 2019-02-10 NOTE — Progress Notes (Signed)
Pt reports she has been off of Amitriptyline, Cymbalta, Vyvnase, and Maxalt for 48 hrs.

## 2019-02-10 NOTE — Discharge Instructions (Signed)
Myelogram Discharge Instructions  1. Go home and rest quietly for the next 24 hours.  It is important to lie flat for the next 24 hours.  Get up only to go to the restroom.  You may lie in the bed or on a couch on your back, your stomach, your left side or your right side.  You may have one pillow under your head.  You may have pillows between your knees while you are on your side or under your knees while you are on your back.  2. DO NOT drive today.  Recline the seat as far back as it will go, while still wearing your seat belt, on the way home.  3. You may get up to go to the bathroom as needed.  You may sit up for 10 minutes to eat.  You may resume your normal diet and medications unless otherwise indicated.  Drink lots of extra fluids today and tomorrow.  4. The incidence of headache, nausea, or vomiting is about 5% (one in 20 patients).  If you develop a headache, lie flat and drink plenty of fluids until the headache goes away.  Caffeinated beverages may be helpful.  If you develop severe nausea and vomiting or a headache that does not go away with flat bed rest, call [phone].  5. You may resume normal activities after your 24 hours of bed rest is over; however, do not exert yourself strongly or do any heavy lifting tomorrow. If when you get up you have a headache when standing, go back to bed and force fluids for another 24 hours.  6. Call your physician for a follow-up appointment.  The results of your myelogram will be sent directly to your physician by the following day.  7. If you have any questions or if complications develop after you arrive home, please call [phone].  Discharge instructions have been explained to the patient.  The patient, or the person responsible for the patient, fully understands these instructions.  YOU MAY RESTART YOUR AMITRIPTYLINE, CYMBALTA, VYVANSE, AND MAXALT TOMORROW 02/11/2019 AT 09:30AM.

## 2019-03-08 ENCOUNTER — Other Ambulatory Visit: Payer: Self-pay | Admitting: Physician Assistant

## 2019-03-08 DIAGNOSIS — Z1231 Encounter for screening mammogram for malignant neoplasm of breast: Secondary | ICD-10-CM

## 2019-04-19 ENCOUNTER — Other Ambulatory Visit: Payer: Self-pay

## 2019-04-19 ENCOUNTER — Ambulatory Visit
Admission: RE | Admit: 2019-04-19 | Discharge: 2019-04-19 | Disposition: A | Discharge status: Home / Self Care | Payer: BC Managed Care – PPO | Source: Ambulatory Visit | Attending: Physician Assistant | Admitting: Physician Assistant

## 2019-04-19 DIAGNOSIS — Z1231 Encounter for screening mammogram for malignant neoplasm of breast: Secondary | ICD-10-CM

## 2019-05-26 ENCOUNTER — Other Ambulatory Visit: Payer: Self-pay | Admitting: Physician Assistant

## 2019-05-26 ENCOUNTER — Other Ambulatory Visit: Payer: Self-pay

## 2019-05-26 ENCOUNTER — Ambulatory Visit
Admission: RE | Admit: 2019-05-26 | Discharge: 2019-05-26 | Disposition: A | Discharge status: Home / Self Care | Payer: BC Managed Care – PPO | Source: Ambulatory Visit | Attending: Physician Assistant | Admitting: Physician Assistant

## 2019-05-26 DIAGNOSIS — Z01818 Encounter for other preprocedural examination: Secondary | ICD-10-CM

## 2020-03-12 ENCOUNTER — Other Ambulatory Visit: Payer: Self-pay | Admitting: Physician Assistant

## 2020-03-12 DIAGNOSIS — Z1231 Encounter for screening mammogram for malignant neoplasm of breast: Secondary | ICD-10-CM

## 2020-04-23 ENCOUNTER — Ambulatory Visit: Payer: BC Managed Care – PPO

## 2020-05-16 ENCOUNTER — Other Ambulatory Visit: Payer: Self-pay

## 2020-05-16 ENCOUNTER — Ambulatory Visit
Admission: RE | Admit: 2020-05-16 | Discharge: 2020-05-16 | Disposition: A | Discharge status: Home / Self Care | Payer: BC Managed Care – PPO | Source: Ambulatory Visit | Attending: Physician Assistant | Admitting: Physician Assistant

## 2020-05-16 DIAGNOSIS — Z1231 Encounter for screening mammogram for malignant neoplasm of breast: Secondary | ICD-10-CM

## 2020-08-30 IMAGING — CR DG MYELOGRAPHY LUMBAR INJ LUMBOSACRAL
12 series · 12 of 12 positions shown · non-contrast
Comparison: Lumbar spine MRI 12/24/2018

CLINICAL DATA: Right lower extremity pain. History of scoliosis and
remote thoracolumbar fusion.
TECHNIQUE: Contiguous axial images were obtained through the Lumbar spine after
the intrathecal infusion of infusion. Coronal and sagittal
reconstructions were obtained of the axial image sets.

[vasc standard (1 of 9)]
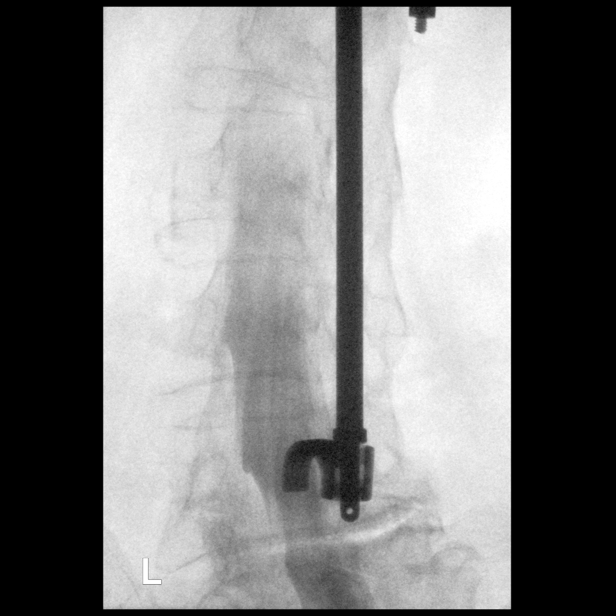

[w lumbar spine lat]
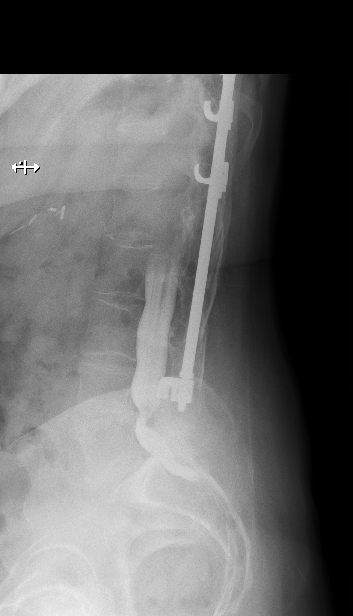

[w lumbar spine flexion]
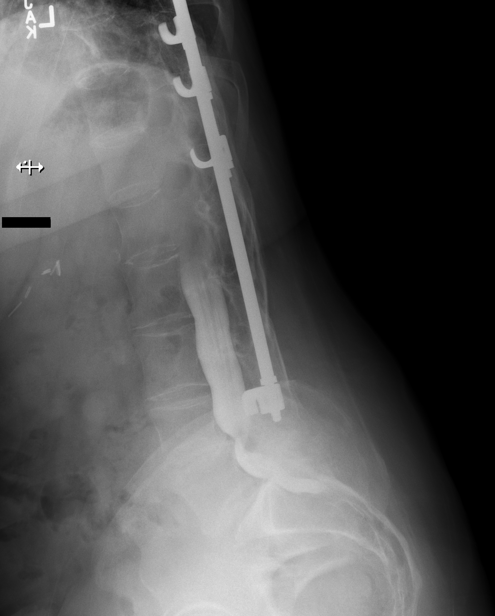

[vasc standard (2 of 9)]
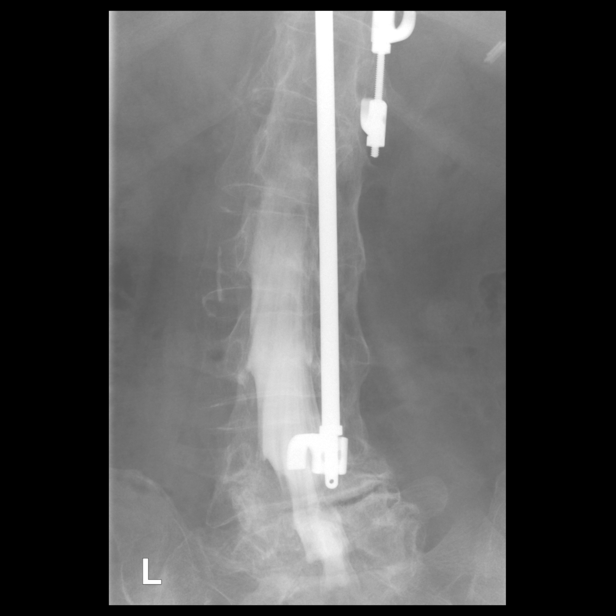

[w lumbar spine extension]
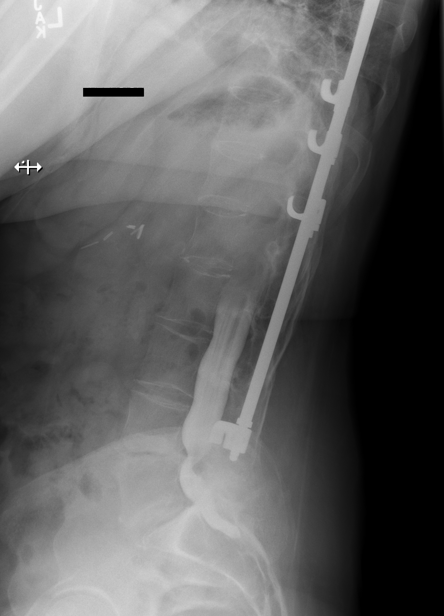

[vasc standard (3 of 9)]
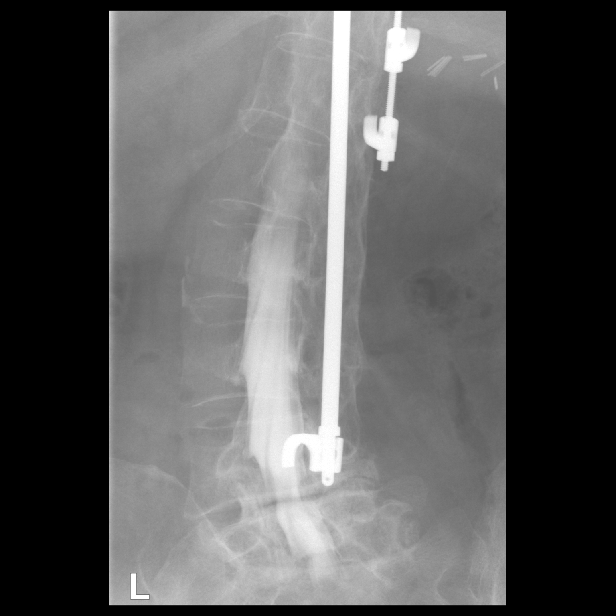

[vasc standard (4 of 9)]
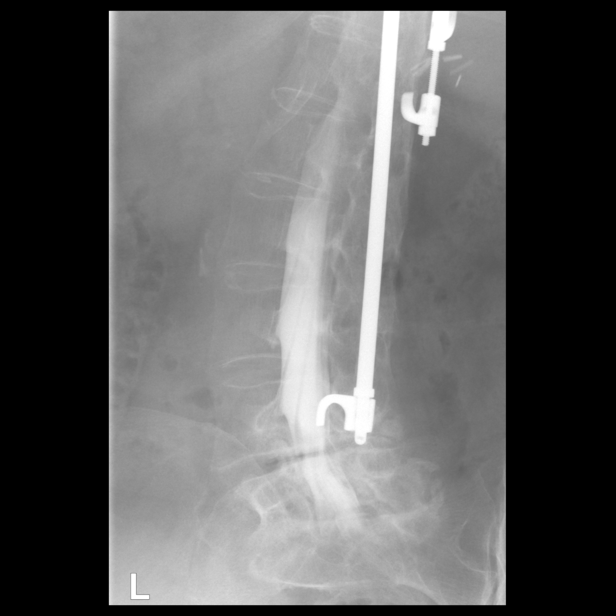

[vasc standard (5 of 9)]
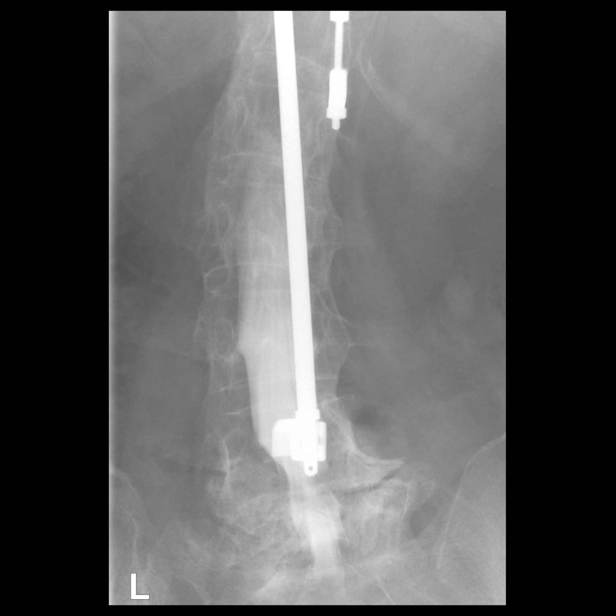

[vasc standard (6 of 9)]
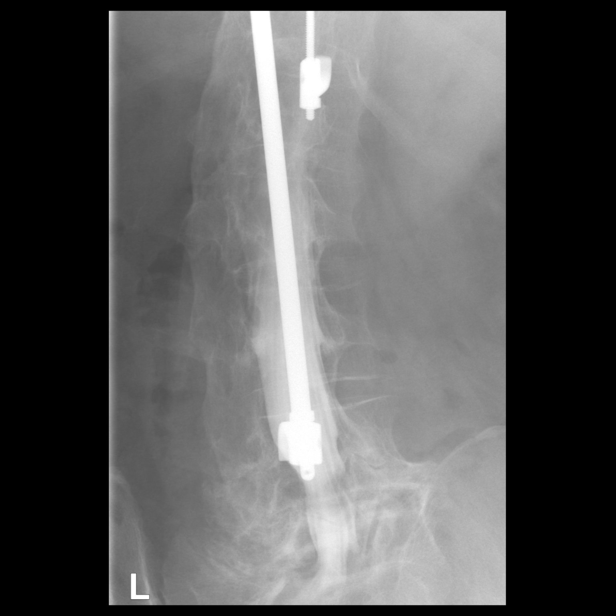

[vasc standard (7 of 9)]
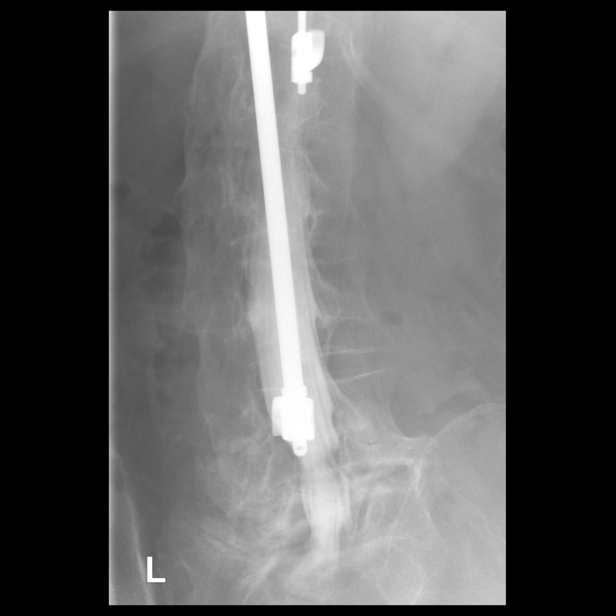

[vasc standard (8 of 9)]
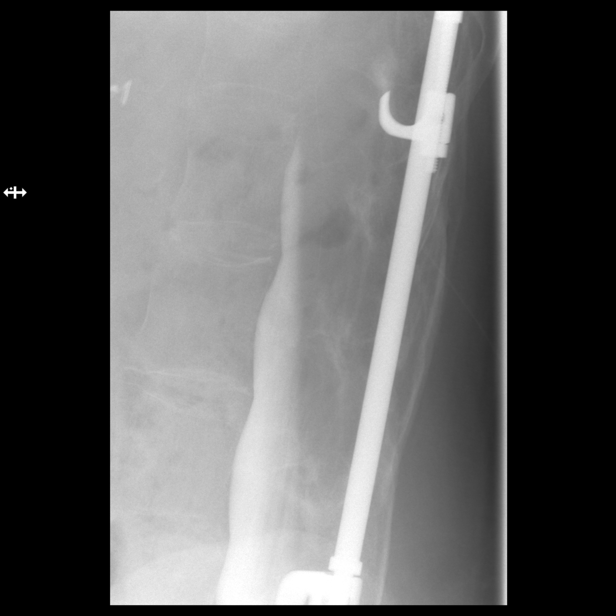

[vasc standard (9 of 9)]
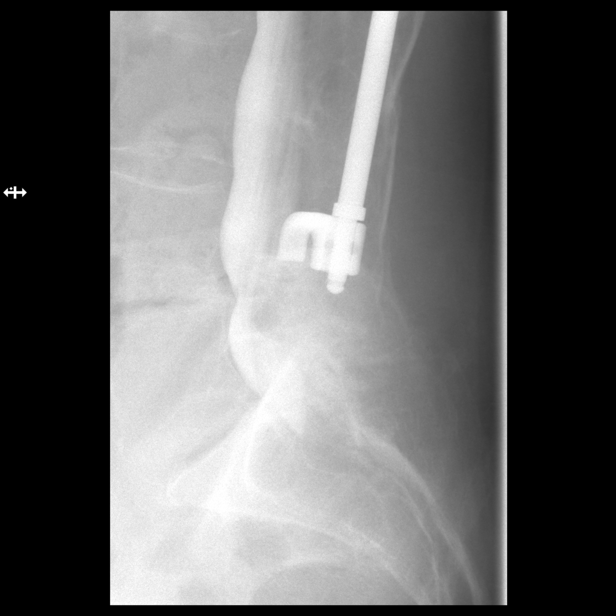

[12 of 12 positions shown; findings below may reference images not displayed]

EXAM:
LUMBAR MYELOGRAM

FLUOROSCOPY TIME:  Fluoroscopy Time: 18 seconds

Radiation Exposure Index: 198.9 microGray*m^2

PROCEDURE:
After thorough discussion of risks and benefits of the procedure
including bleeding, infection, injury to nerves, blood vessels,
adjacent structures as well as headache and CSF leak, written and
oral informed consent was obtained. Consent was obtained by Dr.
Sayed Zaher Fadaee. Time out form was completed.

Patient was positioned prone on the fluoroscopy table. Local
anesthesia was provided with 1% lidocaine without epinephrine after
prepped and draped in the usual sterile fashion. Puncture was
performed at L5-S1 using a 3 1/2 inch 22-gauge spinal needle via a
left interlaminar approach. Using a single pass through the dura,
the needle was placed within the thecal sac, with return of clear
CSF. 15 mL of Isovue 4-LPP was injected into the thecal sac, with
normal opacification of the nerve roots and cauda equina consistent
with free flow within the subarachnoid space.

I personally performed the lumbar puncture and administered the
intrathecal contrast. I also personally supervised acquisition of
the myelogram images.
FINDINGS: LUMBAR MYELOGRAM FINDINGS:

There are 5 non rib-bearing lumbar type vertebrae. Grade 1
anterolisthesis of L5 on S1 does not significantly change with
flexion or extension. Lumbar levoscoliosis is noted, and thoracic
dextroscoliosis is partially visualized. Sequelae of posterior
thoracolumbar fusion are again identified with a single Harrington
rod extending to the L4 level on the right. The thecal sac appears
widely patent at L3-4 and above. There is advanced disc degeneration
at L4-5 with evidence of moderate to severe spinal stenosis which
does not significantly worsen with standing. There is effacement of
the L5 nerve roots sleeves.

CT LUMBAR MYELOGRAM FINDINGS:

There is moderate lumbar levoscoliosis. Grade 1 anterolisthesis of
L5 on S1 measures 5 mm and is facet mediated. There is trace
anterolisthesis of L4 on L5, and there is also leftward translation
of L4 on L5. No acute fracture or suspicious osseous lesion is
identified. Solid thoracolumbar posterior osseous fusion is
partially visualized to the L4 level. Posterior spinal rods extend
to the T12 and L4 levels. The conus medullaris terminates at L1.
Cholecystectomy clips and mild abdominal aortic atherosclerosis are
noted.

T11-12 through L3-4: Solid posterior fusion.  No stenosis.

L4-5: Advanced disc degeneration with asymmetric severe right-sided
disc space narrowing, degenerative endplate changes, and vacuum
disc. Disc bulging, endplate spurring, disc space height loss, and
severe posterior element hypertrophy result in moderate to severe
spinal stenosis and severe right neural foraminal stenosis, likely
with right L4 nerve root compression in the foramen.

L5-S1: Asymmetric left-sided disc space height loss, degenerative
endplate changes, and vacuum disc. Disc bulging, disc space height
loss, and severe bilateral facet hypertrophy result in mild
bilateral lateral recess stenosis and mild right and moderate left
neural foraminal stenosis with potential left L5 nerve root
impingement. No spinal stenosis.
IMPRESSION: 1. Severe disc and facet degeneration at L4-5 and L5-S1.
2. Moderate to severe spinal stenosis and severe right neural
foraminal stenosis at L4-5.
3. Mild lateral recess and mild-to-moderate neural foraminal
stenosis at L5-S1.
4. Solid posterior thoracolumbar fusion extending to the L4 level.
Wide patency of the spinal canal and neural foramina at L3-4 and
above.

Aortic Atherosclerosis ([id number]-JJ9.9).

## 2021-04-12 ENCOUNTER — Other Ambulatory Visit: Payer: Self-pay | Admitting: Physician Assistant

## 2021-04-12 DIAGNOSIS — Z1231 Encounter for screening mammogram for malignant neoplasm of breast: Secondary | ICD-10-CM

## 2021-05-17 ENCOUNTER — Ambulatory Visit
Admission: RE | Admit: 2021-05-17 | Discharge: 2021-05-17 | Disposition: A | Discharge status: Home / Self Care | Payer: BC Managed Care – PPO | Source: Ambulatory Visit | Attending: Physician Assistant | Admitting: Physician Assistant

## 2021-05-17 ENCOUNTER — Other Ambulatory Visit: Payer: Self-pay

## 2021-05-17 DIAGNOSIS — Z1231 Encounter for screening mammogram for malignant neoplasm of breast: Secondary | ICD-10-CM

## 2022-04-09 ENCOUNTER — Other Ambulatory Visit: Payer: Self-pay | Admitting: Physician Assistant

## 2022-04-09 DIAGNOSIS — Z1231 Encounter for screening mammogram for malignant neoplasm of breast: Secondary | ICD-10-CM

## 2022-06-02 ENCOUNTER — Ambulatory Visit: Payer: BC Managed Care – PPO

## 2022-07-02 ENCOUNTER — Ambulatory Visit
Admission: RE | Admit: 2022-07-02 | Discharge: 2022-07-02 | Disposition: A | Discharge status: Home / Self Care | Payer: Medicare Other | Source: Ambulatory Visit | Attending: Physician Assistant | Admitting: Physician Assistant

## 2022-07-02 DIAGNOSIS — Z1231 Encounter for screening mammogram for malignant neoplasm of breast: Secondary | ICD-10-CM

## 2022-10-08 ENCOUNTER — Ambulatory Visit (INDEPENDENT_AMBULATORY_CARE_PROVIDER_SITE_OTHER): Payer: Medicare Other

## 2022-10-08 ENCOUNTER — Ambulatory Visit: Payer: Medicare Other | Admitting: Podiatry

## 2022-10-08 DIAGNOSIS — M2041 Other hammer toe(s) (acquired), right foot: Secondary | ICD-10-CM | POA: Diagnosis not present

## 2022-10-08 DIAGNOSIS — M79671 Pain in right foot: Secondary | ICD-10-CM | POA: Diagnosis not present

## 2022-10-08 NOTE — Progress Notes (Signed)
Chief Complaint  Patient presents with   Hammer Toe    Patient came in today for right foot hammertoes, patient denies any pain, X-Rays taken today     HPI: 62 y.o. female presenting today as a new patient for evaluation of malalignment of the second digit and fourth digit of the right foot.  Patient states that she has a history of recurrent injuries to the second and fourth digit of the right foot which has exacerbated her hammertoes.  Minimally painful.  They are uncomfortable in certain shoes but overall they are somewhat tolerable.  Presenting for further treatment and evaluation  Past Medical History:  Diagnosis Date   ADHD (attention deficit hyperactivity disorder)    Anxiety    Chronic constipation    Depression    GERD (gastroesophageal reflux disease)    Hyperlipidemia    Left ureteral calculus    Multilevel scoliosis    TMJ (temporomandibular joint syndrome)    OCCASIONAL USE OF BITE GUARD   Wears contact lenses     Past Surgical History:  Procedure Laterality Date   BACK SURGERY  1975   scoliosis correction with herrington rods   BREAST BIOPSY Left    21 years ago    [place]  07/03/2011   Procedure: CARPAL TUNNEL RELEASE;  Surgeon: Cammie Sickle., MD;  Location: [facility];  Service: Orthopedics;  Laterality: Right;   CESAREAN SECTION  1987   CYSTOSCOPY W/ URETERAL STENT PLACEMENT Left 04/30/2014   Procedure: CYSTOSCOPY WITH LEFT URETERAL STENT PLACEMENT;  Surgeon: Jorja Loa, MD;  Location: [facility];  Service: Urology;  Laterality: Left;   CYSTOSCOPY W/ URETERAL STENT PLACEMENT Left 05/19/2014   Procedure: CYSTOSCOPY WITH STENT REPLACEMENT;  Surgeon: Jorja Loa, MD;  Location: [facility];  Service: Urology;  Laterality: Left;   CYSTOSCOPY WITH RETROGRADE PYELOGRAM, URETEROSCOPY AND STENT PLACEMENT Left 05/19/2014   Procedure: CYSTOSCOPY WITH LEFT RETROGRADE PYELOGRAM, URETEROSCOPY AND STENT  EXTRACTION;  Surgeon: Jorja Loa, MD;  Location: [facility];  Service: Urology;  Laterality: Left;   DILATATION & CURRETTAGE/HYSTEROSCOPY WITH RESECTOCOPE  04-30-2000   submucous fibroid   HOLMIUM LASER APPLICATION Left [id number]   Procedure: HOLMIUM LASER APPLICATION;  Surgeon: Jorja Loa, MD;  Location: [facility];  Service: Urology;  Laterality: Left;   LAPAROSCOPIC CHOLECYSTECTOMY  07-01-2010   TONSILLECTOMY  as child   VAGINAL HYSTERECTOMY  05-12-2000    Allergies  Allergen Reactions   Penicillins Rash     Physical Exam: General: The patient is alert and oriented x3 in no acute distress.  Dermatology: Skin is warm, dry and supple bilateral lower extremities. Negative for open lesions or macerations.  Vascular: Palpable pedal pulses bilaterally. Capillary refill within normal limits.  Negative for any significant edema or erythema  Neurological: Light touch and protective threshold grossly intact  Musculoskeletal Exam: There is some lateral deviation of the second digit right foot at the level of the DIPJ as well as adductovarus hammertoe deformity to the fourth and fifth digits.  The fourth toe actually curls underneath the third digit with weightbearing  Radiographic Exam 10/08/2022 RT foot:  Normal osseous mineralization. Joint spaces preserved.  Lateral deviation of the distal phalanx at the level of the DIPJ noted to the second digit right foot.  Adductovarus hammertoe deformity also noted to the fourth and fifth digits of the right foot consistent with clinical exam  Assessment: 1.  Malalignment of  the second digit right foot with lateral deviation at the level of the DIPJ 4.  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  Only the fourth toe symptomatic   Plan of Care:  1. Patient evaluated. X-Rays reviewed.  2.  Today we discussed different conservative and surgical management options for the  patient.  Conservatively recommend wide fitting shoes that do not constrict the toebox area.  Patient states that she has tried different shoe gear and she continues to have some mild to moderate discomfort with the toes.  She is considering surgery 3.  If we did pursue the surgical option it would consist of DIPJ arthroplasty second digit right foot with derotational arthroplasty of the fourth digit only of the right foot.  Patient is not symptomatic to the fifth digit.  The procedure was explained in detail.  Risk benefits advantages and disadvantages were also explained.  No guarantees were expressed or implied 4.  The patient would like to contemplate surgery for now.  Return to clinic as needed for surgical consult      Edrick Kins, DPM [facility]  Dr. Edrick Kins, DPM    [address], [place] [address]                Office [phone]  Fax [phone]

## 2023-06-22 ENCOUNTER — Other Ambulatory Visit: Payer: Self-pay | Admitting: Physician Assistant

## 2023-06-22 DIAGNOSIS — Z1231 Encounter for screening mammogram for malignant neoplasm of breast: Secondary | ICD-10-CM

## 2023-06-22 DIAGNOSIS — Z13820 Encounter for screening for osteoporosis: Secondary | ICD-10-CM

## 2023-07-08 ENCOUNTER — Ambulatory Visit
Admission: RE | Admit: 2023-07-08 | Discharge: 2023-07-08 | Disposition: A | Discharge status: Home / Self Care | Payer: Medicare Other | Source: Ambulatory Visit | Attending: Physician Assistant | Admitting: Physician Assistant

## 2023-07-08 DIAGNOSIS — Z1231 Encounter for screening mammogram for malignant neoplasm of breast: Secondary | ICD-10-CM

## 2023-07-09 ENCOUNTER — Ambulatory Visit: Payer: Medicare Other

## 2024-02-12 ENCOUNTER — Other Ambulatory Visit: Payer: Medicare Other

## 2024-02-16 ENCOUNTER — Other Ambulatory Visit (HOSPITAL_BASED_OUTPATIENT_CLINIC_OR_DEPARTMENT_OTHER)

## 2024-03-14 ENCOUNTER — Ambulatory Visit (HOSPITAL_BASED_OUTPATIENT_CLINIC_OR_DEPARTMENT_OTHER)
Admission: RE | Admit: 2024-03-14 | Discharge: 2024-03-14 | Disposition: A | Discharge status: Home / Self Care | Source: Ambulatory Visit | Attending: Physician Assistant | Admitting: Physician Assistant

## 2024-03-14 DIAGNOSIS — M8589 Other specified disorders of bone density and structure, multiple sites: Secondary | ICD-10-CM | POA: Diagnosis not present

## 2024-03-14 DIAGNOSIS — Z13820 Encounter for screening for osteoporosis: Secondary | ICD-10-CM | POA: Insufficient documentation
